# Patient Record
Sex: Female | Born: 1940 | Hispanic: No | Smoking: Never smoker
Health system: Southern US, Community
[De-identification: ages and names within clinical notes are randomized; demographics above are authoritative.]

## PROBLEM LIST (undated history)

## (undated) DIAGNOSIS — E039 Hypothyroidism, unspecified: Secondary | ICD-10-CM

## (undated) DIAGNOSIS — J189 Pneumonia, unspecified organism: Secondary | ICD-10-CM

## (undated) DIAGNOSIS — M1990 Unspecified osteoarthritis, unspecified site: Secondary | ICD-10-CM

## (undated) DIAGNOSIS — I10 Essential (primary) hypertension: Secondary | ICD-10-CM

## (undated) DIAGNOSIS — K219 Gastro-esophageal reflux disease without esophagitis: Secondary | ICD-10-CM

## (undated) HISTORY — PX: TUBAL LIGATION: SHX77

---

## 1999-06-16 ENCOUNTER — Other Ambulatory Visit: Admission: RE | Admit: 1999-06-16 | Discharge: 1999-06-16 | Payer: Self-pay | Admitting: Obstetrics & Gynecology

## 1999-06-16 ENCOUNTER — Encounter (INDEPENDENT_AMBULATORY_CARE_PROVIDER_SITE_OTHER): Payer: Self-pay | Admitting: Specialist

## 1999-11-16 ENCOUNTER — Other Ambulatory Visit: Admission: RE | Admit: 1999-11-16 | Discharge: 1999-11-16 | Payer: Self-pay | Admitting: Obstetrics and Gynecology

## 1999-12-02 ENCOUNTER — Encounter (INDEPENDENT_AMBULATORY_CARE_PROVIDER_SITE_OTHER): Payer: Self-pay

## 1999-12-02 ENCOUNTER — Other Ambulatory Visit: Admission: RE | Admit: 1999-12-02 | Discharge: 1999-12-02 | Payer: Self-pay | Admitting: Obstetrics and Gynecology

## 2000-05-04 ENCOUNTER — Other Ambulatory Visit: Admission: RE | Admit: 2000-05-04 | Discharge: 2000-05-04 | Payer: Self-pay | Admitting: Obstetrics and Gynecology

## 2000-11-07 ENCOUNTER — Other Ambulatory Visit: Admission: RE | Admit: 2000-11-07 | Discharge: 2000-11-07 | Payer: Self-pay | Admitting: Obstetrics and Gynecology

## 2007-07-13 ENCOUNTER — Encounter: Admission: RE | Admit: 2007-07-13 | Discharge: 2007-07-13 | Payer: Self-pay | Admitting: Orthopedic Surgery

## 2016-06-06 DIAGNOSIS — J9801 Acute bronchospasm: Secondary | ICD-10-CM | POA: Diagnosis not present

## 2016-06-06 DIAGNOSIS — J40 Bronchitis, not specified as acute or chronic: Secondary | ICD-10-CM | POA: Diagnosis not present

## 2016-06-19 DIAGNOSIS — E785 Hyperlipidemia, unspecified: Secondary | ICD-10-CM | POA: Diagnosis not present

## 2016-06-19 DIAGNOSIS — J0191 Acute recurrent sinusitis, unspecified: Secondary | ICD-10-CM | POA: Diagnosis not present

## 2016-06-19 DIAGNOSIS — K219 Gastro-esophageal reflux disease without esophagitis: Secondary | ICD-10-CM | POA: Diagnosis not present

## 2016-06-19 DIAGNOSIS — H6981 Other specified disorders of Eustachian tube, right ear: Secondary | ICD-10-CM | POA: Diagnosis not present

## 2016-06-19 DIAGNOSIS — E039 Hypothyroidism, unspecified: Secondary | ICD-10-CM | POA: Diagnosis not present

## 2016-06-19 DIAGNOSIS — Z79899 Other long term (current) drug therapy: Secondary | ICD-10-CM | POA: Diagnosis not present

## 2016-06-19 DIAGNOSIS — I10 Essential (primary) hypertension: Secondary | ICD-10-CM | POA: Diagnosis not present

## 2016-06-19 DIAGNOSIS — R05 Cough: Secondary | ICD-10-CM | POA: Diagnosis not present

## 2016-06-21 DIAGNOSIS — J0191 Acute recurrent sinusitis, unspecified: Secondary | ICD-10-CM | POA: Diagnosis not present

## 2016-06-21 DIAGNOSIS — J329 Chronic sinusitis, unspecified: Secondary | ICD-10-CM | POA: Diagnosis not present

## 2016-06-21 DIAGNOSIS — R05 Cough: Secondary | ICD-10-CM | POA: Diagnosis not present

## 2016-09-20 DIAGNOSIS — M5137 Other intervertebral disc degeneration, lumbosacral region: Secondary | ICD-10-CM | POA: Diagnosis not present

## 2016-09-20 DIAGNOSIS — E785 Hyperlipidemia, unspecified: Secondary | ICD-10-CM | POA: Diagnosis not present

## 2016-09-20 DIAGNOSIS — I10 Essential (primary) hypertension: Secondary | ICD-10-CM | POA: Diagnosis not present

## 2016-09-20 DIAGNOSIS — E039 Hypothyroidism, unspecified: Secondary | ICD-10-CM | POA: Diagnosis not present

## 2016-09-20 DIAGNOSIS — M47896 Other spondylosis, lumbar region: Secondary | ICD-10-CM | POA: Diagnosis not present

## 2016-09-20 DIAGNOSIS — M5136 Other intervertebral disc degeneration, lumbar region: Secondary | ICD-10-CM | POA: Diagnosis not present

## 2016-09-20 DIAGNOSIS — M25562 Pain in left knee: Secondary | ICD-10-CM | POA: Diagnosis not present

## 2016-09-20 DIAGNOSIS — M47817 Spondylosis without myelopathy or radiculopathy, lumbosacral region: Secondary | ICD-10-CM | POA: Diagnosis not present

## 2016-09-20 DIAGNOSIS — M47897 Other spondylosis, lumbosacral region: Secondary | ICD-10-CM | POA: Diagnosis not present

## 2016-09-20 DIAGNOSIS — M549 Dorsalgia, unspecified: Secondary | ICD-10-CM | POA: Diagnosis not present

## 2016-10-18 DIAGNOSIS — M1712 Unilateral primary osteoarthritis, left knee: Secondary | ICD-10-CM | POA: Diagnosis not present

## 2016-11-01 DIAGNOSIS — I10 Essential (primary) hypertension: Secondary | ICD-10-CM | POA: Diagnosis not present

## 2016-11-15 DIAGNOSIS — E785 Hyperlipidemia, unspecified: Secondary | ICD-10-CM | POA: Diagnosis not present

## 2016-11-15 DIAGNOSIS — Z79899 Other long term (current) drug therapy: Secondary | ICD-10-CM | POA: Diagnosis not present

## 2016-12-19 DIAGNOSIS — I10 Essential (primary) hypertension: Secondary | ICD-10-CM | POA: Diagnosis not present

## 2016-12-19 DIAGNOSIS — Z79899 Other long term (current) drug therapy: Secondary | ICD-10-CM | POA: Diagnosis not present

## 2016-12-19 DIAGNOSIS — L989 Disorder of the skin and subcutaneous tissue, unspecified: Secondary | ICD-10-CM | POA: Diagnosis not present

## 2016-12-27 DIAGNOSIS — H5203 Hypermetropia, bilateral: Secondary | ICD-10-CM | POA: Diagnosis not present

## 2017-01-02 DIAGNOSIS — I10 Essential (primary) hypertension: Secondary | ICD-10-CM | POA: Diagnosis not present

## 2017-01-02 DIAGNOSIS — M549 Dorsalgia, unspecified: Secondary | ICD-10-CM | POA: Diagnosis not present

## 2017-01-30 DIAGNOSIS — R7309 Other abnormal glucose: Secondary | ICD-10-CM | POA: Diagnosis not present

## 2017-01-30 DIAGNOSIS — E538 Deficiency of other specified B group vitamins: Secondary | ICD-10-CM | POA: Diagnosis not present

## 2017-01-30 DIAGNOSIS — I10 Essential (primary) hypertension: Secondary | ICD-10-CM | POA: Diagnosis not present

## 2017-01-30 DIAGNOSIS — Z79899 Other long term (current) drug therapy: Secondary | ICD-10-CM | POA: Diagnosis not present

## 2017-01-30 DIAGNOSIS — K219 Gastro-esophageal reflux disease without esophagitis: Secondary | ICD-10-CM | POA: Diagnosis not present

## 2017-02-21 DIAGNOSIS — R69 Illness, unspecified: Secondary | ICD-10-CM | POA: Diagnosis not present

## 2017-05-01 DIAGNOSIS — I10 Essential (primary) hypertension: Secondary | ICD-10-CM | POA: Diagnosis not present

## 2017-05-01 DIAGNOSIS — Z79899 Other long term (current) drug therapy: Secondary | ICD-10-CM | POA: Diagnosis not present

## 2017-05-01 DIAGNOSIS — K219 Gastro-esophageal reflux disease without esophagitis: Secondary | ICD-10-CM | POA: Diagnosis not present

## 2017-05-01 DIAGNOSIS — E039 Hypothyroidism, unspecified: Secondary | ICD-10-CM | POA: Diagnosis not present

## 2017-05-01 DIAGNOSIS — E785 Hyperlipidemia, unspecified: Secondary | ICD-10-CM | POA: Diagnosis not present

## 2017-05-01 DIAGNOSIS — M1712 Unilateral primary osteoarthritis, left knee: Secondary | ICD-10-CM | POA: Diagnosis not present

## 2017-05-01 DIAGNOSIS — Z23 Encounter for immunization: Secondary | ICD-10-CM | POA: Diagnosis not present

## 2017-05-03 DIAGNOSIS — M1712 Unilateral primary osteoarthritis, left knee: Secondary | ICD-10-CM | POA: Diagnosis not present

## 2017-05-29 HISTORY — PX: TOTAL KNEE ARTHROPLASTY: SHX125

## 2017-05-31 DIAGNOSIS — Z01818 Encounter for other preprocedural examination: Secondary | ICD-10-CM | POA: Diagnosis not present

## 2017-05-31 DIAGNOSIS — J439 Emphysema, unspecified: Secondary | ICD-10-CM | POA: Diagnosis not present

## 2017-05-31 DIAGNOSIS — R52 Pain, unspecified: Secondary | ICD-10-CM | POA: Diagnosis not present

## 2017-05-31 DIAGNOSIS — M79609 Pain in unspecified limb: Secondary | ICD-10-CM | POA: Diagnosis not present

## 2017-05-31 DIAGNOSIS — Z79899 Other long term (current) drug therapy: Secondary | ICD-10-CM | POA: Diagnosis not present

## 2017-05-31 DIAGNOSIS — E559 Vitamin D deficiency, unspecified: Secondary | ICD-10-CM | POA: Diagnosis not present

## 2017-06-12 DIAGNOSIS — M1712 Unilateral primary osteoarthritis, left knee: Secondary | ICD-10-CM | POA: Diagnosis not present

## 2017-06-13 DIAGNOSIS — E785 Hyperlipidemia, unspecified: Secondary | ICD-10-CM | POA: Diagnosis not present

## 2017-06-13 DIAGNOSIS — Z01818 Encounter for other preprocedural examination: Secondary | ICD-10-CM | POA: Diagnosis not present

## 2017-06-13 DIAGNOSIS — E039 Hypothyroidism, unspecified: Secondary | ICD-10-CM | POA: Diagnosis not present

## 2017-06-13 DIAGNOSIS — I10 Essential (primary) hypertension: Secondary | ICD-10-CM | POA: Diagnosis not present

## 2017-06-19 DIAGNOSIS — Z79899 Other long term (current) drug therapy: Secondary | ICD-10-CM | POA: Diagnosis not present

## 2017-06-19 DIAGNOSIS — Z6841 Body Mass Index (BMI) 40.0 and over, adult: Secondary | ICD-10-CM | POA: Diagnosis not present

## 2017-06-19 DIAGNOSIS — Z7982 Long term (current) use of aspirin: Secondary | ICD-10-CM | POA: Diagnosis not present

## 2017-06-19 DIAGNOSIS — Z471 Aftercare following joint replacement surgery: Secondary | ICD-10-CM | POA: Diagnosis not present

## 2017-06-19 DIAGNOSIS — E785 Hyperlipidemia, unspecified: Secondary | ICD-10-CM | POA: Diagnosis not present

## 2017-06-19 DIAGNOSIS — M1712 Unilateral primary osteoarthritis, left knee: Secondary | ICD-10-CM | POA: Diagnosis not present

## 2017-06-19 DIAGNOSIS — E039 Hypothyroidism, unspecified: Secondary | ICD-10-CM | POA: Diagnosis not present

## 2017-06-19 DIAGNOSIS — I10 Essential (primary) hypertension: Secondary | ICD-10-CM | POA: Diagnosis not present

## 2017-06-19 DIAGNOSIS — K219 Gastro-esophageal reflux disease without esophagitis: Secondary | ICD-10-CM | POA: Diagnosis not present

## 2017-06-19 DIAGNOSIS — Z96652 Presence of left artificial knee joint: Secondary | ICD-10-CM | POA: Diagnosis not present

## 2017-06-22 DIAGNOSIS — I10 Essential (primary) hypertension: Secondary | ICD-10-CM | POA: Diagnosis not present

## 2017-06-22 DIAGNOSIS — I700 Atherosclerosis of aorta: Secondary | ICD-10-CM | POA: Diagnosis not present

## 2017-06-22 DIAGNOSIS — Z9181 History of falling: Secondary | ICD-10-CM | POA: Diagnosis not present

## 2017-06-22 DIAGNOSIS — Z471 Aftercare following joint replacement surgery: Secondary | ICD-10-CM | POA: Diagnosis not present

## 2017-06-22 DIAGNOSIS — Z6841 Body Mass Index (BMI) 40.0 and over, adult: Secondary | ICD-10-CM | POA: Diagnosis not present

## 2017-06-22 DIAGNOSIS — Z96652 Presence of left artificial knee joint: Secondary | ICD-10-CM | POA: Diagnosis not present

## 2017-06-22 DIAGNOSIS — E039 Hypothyroidism, unspecified: Secondary | ICD-10-CM | POA: Diagnosis not present

## 2017-06-22 DIAGNOSIS — K219 Gastro-esophageal reflux disease without esophagitis: Secondary | ICD-10-CM | POA: Diagnosis not present

## 2017-06-25 DIAGNOSIS — Z6841 Body Mass Index (BMI) 40.0 and over, adult: Secondary | ICD-10-CM | POA: Diagnosis not present

## 2017-06-25 DIAGNOSIS — Z471 Aftercare following joint replacement surgery: Secondary | ICD-10-CM | POA: Diagnosis not present

## 2017-06-25 DIAGNOSIS — K219 Gastro-esophageal reflux disease without esophagitis: Secondary | ICD-10-CM | POA: Diagnosis not present

## 2017-06-25 DIAGNOSIS — I10 Essential (primary) hypertension: Secondary | ICD-10-CM | POA: Diagnosis not present

## 2017-06-25 DIAGNOSIS — Z96652 Presence of left artificial knee joint: Secondary | ICD-10-CM | POA: Diagnosis not present

## 2017-06-25 DIAGNOSIS — E039 Hypothyroidism, unspecified: Secondary | ICD-10-CM | POA: Diagnosis not present

## 2017-06-25 DIAGNOSIS — Z9181 History of falling: Secondary | ICD-10-CM | POA: Diagnosis not present

## 2017-06-25 DIAGNOSIS — I700 Atherosclerosis of aorta: Secondary | ICD-10-CM | POA: Diagnosis not present

## 2017-06-26 DIAGNOSIS — I10 Essential (primary) hypertension: Secondary | ICD-10-CM | POA: Diagnosis not present

## 2017-06-26 DIAGNOSIS — I700 Atherosclerosis of aorta: Secondary | ICD-10-CM | POA: Diagnosis not present

## 2017-06-26 DIAGNOSIS — Z9181 History of falling: Secondary | ICD-10-CM | POA: Diagnosis not present

## 2017-06-26 DIAGNOSIS — Z471 Aftercare following joint replacement surgery: Secondary | ICD-10-CM | POA: Diagnosis not present

## 2017-06-26 DIAGNOSIS — K219 Gastro-esophageal reflux disease without esophagitis: Secondary | ICD-10-CM | POA: Diagnosis not present

## 2017-06-26 DIAGNOSIS — E039 Hypothyroidism, unspecified: Secondary | ICD-10-CM | POA: Diagnosis not present

## 2017-06-26 DIAGNOSIS — Z6841 Body Mass Index (BMI) 40.0 and over, adult: Secondary | ICD-10-CM | POA: Diagnosis not present

## 2017-06-26 DIAGNOSIS — Z96652 Presence of left artificial knee joint: Secondary | ICD-10-CM | POA: Diagnosis not present

## 2017-06-27 DIAGNOSIS — Z6841 Body Mass Index (BMI) 40.0 and over, adult: Secondary | ICD-10-CM | POA: Diagnosis not present

## 2017-06-27 DIAGNOSIS — I10 Essential (primary) hypertension: Secondary | ICD-10-CM | POA: Diagnosis not present

## 2017-06-27 DIAGNOSIS — E039 Hypothyroidism, unspecified: Secondary | ICD-10-CM | POA: Diagnosis not present

## 2017-06-27 DIAGNOSIS — K219 Gastro-esophageal reflux disease without esophagitis: Secondary | ICD-10-CM | POA: Diagnosis not present

## 2017-06-27 DIAGNOSIS — Z471 Aftercare following joint replacement surgery: Secondary | ICD-10-CM | POA: Diagnosis not present

## 2017-06-27 DIAGNOSIS — I700 Atherosclerosis of aorta: Secondary | ICD-10-CM | POA: Diagnosis not present

## 2017-06-27 DIAGNOSIS — Z9181 History of falling: Secondary | ICD-10-CM | POA: Diagnosis not present

## 2017-06-27 DIAGNOSIS — Z96652 Presence of left artificial knee joint: Secondary | ICD-10-CM | POA: Diagnosis not present

## 2017-06-28 DIAGNOSIS — I10 Essential (primary) hypertension: Secondary | ICD-10-CM | POA: Diagnosis not present

## 2017-06-28 DIAGNOSIS — K219 Gastro-esophageal reflux disease without esophagitis: Secondary | ICD-10-CM | POA: Diagnosis not present

## 2017-06-28 DIAGNOSIS — Z471 Aftercare following joint replacement surgery: Secondary | ICD-10-CM | POA: Diagnosis not present

## 2017-06-28 DIAGNOSIS — Z9181 History of falling: Secondary | ICD-10-CM | POA: Diagnosis not present

## 2017-06-28 DIAGNOSIS — I700 Atherosclerosis of aorta: Secondary | ICD-10-CM | POA: Diagnosis not present

## 2017-06-28 DIAGNOSIS — Z6841 Body Mass Index (BMI) 40.0 and over, adult: Secondary | ICD-10-CM | POA: Diagnosis not present

## 2017-06-28 DIAGNOSIS — E039 Hypothyroidism, unspecified: Secondary | ICD-10-CM | POA: Diagnosis not present

## 2017-06-28 DIAGNOSIS — Z96652 Presence of left artificial knee joint: Secondary | ICD-10-CM | POA: Diagnosis not present

## 2017-06-29 DIAGNOSIS — Z96652 Presence of left artificial knee joint: Secondary | ICD-10-CM | POA: Diagnosis not present

## 2017-06-29 DIAGNOSIS — Z6841 Body Mass Index (BMI) 40.0 and over, adult: Secondary | ICD-10-CM | POA: Diagnosis not present

## 2017-06-29 DIAGNOSIS — E039 Hypothyroidism, unspecified: Secondary | ICD-10-CM | POA: Diagnosis not present

## 2017-06-29 DIAGNOSIS — K219 Gastro-esophageal reflux disease without esophagitis: Secondary | ICD-10-CM | POA: Diagnosis not present

## 2017-06-29 DIAGNOSIS — Z471 Aftercare following joint replacement surgery: Secondary | ICD-10-CM | POA: Diagnosis not present

## 2017-06-29 DIAGNOSIS — I10 Essential (primary) hypertension: Secondary | ICD-10-CM | POA: Diagnosis not present

## 2017-06-29 DIAGNOSIS — Z9181 History of falling: Secondary | ICD-10-CM | POA: Diagnosis not present

## 2017-06-29 DIAGNOSIS — I700 Atherosclerosis of aorta: Secondary | ICD-10-CM | POA: Diagnosis not present

## 2017-07-03 DIAGNOSIS — M1712 Unilateral primary osteoarthritis, left knee: Secondary | ICD-10-CM | POA: Diagnosis not present

## 2017-07-03 DIAGNOSIS — M4120 Other idiopathic scoliosis, site unspecified: Secondary | ICD-10-CM | POA: Diagnosis not present

## 2017-07-03 DIAGNOSIS — M25562 Pain in left knee: Secondary | ICD-10-CM | POA: Diagnosis not present

## 2017-07-06 DIAGNOSIS — M1712 Unilateral primary osteoarthritis, left knee: Secondary | ICD-10-CM | POA: Diagnosis not present

## 2017-07-06 DIAGNOSIS — M25562 Pain in left knee: Secondary | ICD-10-CM | POA: Diagnosis not present

## 2017-07-30 DIAGNOSIS — E785 Hyperlipidemia, unspecified: Secondary | ICD-10-CM | POA: Diagnosis not present

## 2017-07-30 DIAGNOSIS — K219 Gastro-esophageal reflux disease without esophagitis: Secondary | ICD-10-CM | POA: Diagnosis not present

## 2017-07-30 DIAGNOSIS — M1712 Unilateral primary osteoarthritis, left knee: Secondary | ICD-10-CM | POA: Diagnosis not present

## 2017-07-30 DIAGNOSIS — E559 Vitamin D deficiency, unspecified: Secondary | ICD-10-CM | POA: Diagnosis not present

## 2017-07-30 DIAGNOSIS — Z79899 Other long term (current) drug therapy: Secondary | ICD-10-CM | POA: Diagnosis not present

## 2017-07-30 DIAGNOSIS — I10 Essential (primary) hypertension: Secondary | ICD-10-CM | POA: Diagnosis not present

## 2017-07-30 DIAGNOSIS — E039 Hypothyroidism, unspecified: Secondary | ICD-10-CM | POA: Diagnosis not present

## 2017-07-30 DIAGNOSIS — M62838 Other muscle spasm: Secondary | ICD-10-CM | POA: Diagnosis not present

## 2017-08-01 DIAGNOSIS — M1712 Unilateral primary osteoarthritis, left knee: Secondary | ICD-10-CM | POA: Diagnosis not present

## 2017-09-03 DIAGNOSIS — Z1231 Encounter for screening mammogram for malignant neoplasm of breast: Secondary | ICD-10-CM | POA: Diagnosis not present

## 2017-09-03 DIAGNOSIS — I10 Essential (primary) hypertension: Secondary | ICD-10-CM | POA: Diagnosis not present

## 2017-09-03 DIAGNOSIS — M7989 Other specified soft tissue disorders: Secondary | ICD-10-CM | POA: Diagnosis not present

## 2017-09-10 DIAGNOSIS — Z79899 Other long term (current) drug therapy: Secondary | ICD-10-CM | POA: Diagnosis not present

## 2017-09-25 DIAGNOSIS — Z1231 Encounter for screening mammogram for malignant neoplasm of breast: Secondary | ICD-10-CM | POA: Diagnosis not present

## 2017-10-04 DIAGNOSIS — Z0000 Encounter for general adult medical examination without abnormal findings: Secondary | ICD-10-CM | POA: Diagnosis not present

## 2017-10-04 DIAGNOSIS — E2839 Other primary ovarian failure: Secondary | ICD-10-CM | POA: Diagnosis not present

## 2017-10-04 DIAGNOSIS — Z23 Encounter for immunization: Secondary | ICD-10-CM | POA: Diagnosis not present

## 2017-10-04 DIAGNOSIS — Z1389 Encounter for screening for other disorder: Secondary | ICD-10-CM | POA: Diagnosis not present

## 2017-10-04 DIAGNOSIS — Z1331 Encounter for screening for depression: Secondary | ICD-10-CM | POA: Diagnosis not present

## 2017-10-26 DIAGNOSIS — E2839 Other primary ovarian failure: Secondary | ICD-10-CM | POA: Diagnosis not present

## 2017-10-26 DIAGNOSIS — Z13820 Encounter for screening for osteoporosis: Secondary | ICD-10-CM | POA: Diagnosis not present

## 2017-10-30 DIAGNOSIS — M7989 Other specified soft tissue disorders: Secondary | ICD-10-CM | POA: Diagnosis not present

## 2017-10-30 DIAGNOSIS — R252 Cramp and spasm: Secondary | ICD-10-CM | POA: Diagnosis not present

## 2017-10-30 DIAGNOSIS — K219 Gastro-esophageal reflux disease without esophagitis: Secondary | ICD-10-CM | POA: Diagnosis not present

## 2017-10-30 DIAGNOSIS — I10 Essential (primary) hypertension: Secondary | ICD-10-CM | POA: Diagnosis not present

## 2017-10-30 DIAGNOSIS — E039 Hypothyroidism, unspecified: Secondary | ICD-10-CM | POA: Diagnosis not present

## 2017-10-30 DIAGNOSIS — E785 Hyperlipidemia, unspecified: Secondary | ICD-10-CM | POA: Diagnosis not present

## 2017-10-30 DIAGNOSIS — R0602 Shortness of breath: Secondary | ICD-10-CM | POA: Diagnosis not present

## 2017-10-30 DIAGNOSIS — Z79899 Other long term (current) drug therapy: Secondary | ICD-10-CM | POA: Diagnosis not present

## 2017-10-30 DIAGNOSIS — E559 Vitamin D deficiency, unspecified: Secondary | ICD-10-CM | POA: Diagnosis not present

## 2017-11-01 DIAGNOSIS — M7989 Other specified soft tissue disorders: Secondary | ICD-10-CM | POA: Diagnosis not present

## 2017-11-01 DIAGNOSIS — M79605 Pain in left leg: Secondary | ICD-10-CM | POA: Diagnosis not present

## 2017-11-01 DIAGNOSIS — R7989 Other specified abnormal findings of blood chemistry: Secondary | ICD-10-CM | POA: Diagnosis not present

## 2017-11-01 DIAGNOSIS — R0602 Shortness of breath: Secondary | ICD-10-CM | POA: Diagnosis not present

## 2017-11-01 DIAGNOSIS — M79662 Pain in left lower leg: Secondary | ICD-10-CM | POA: Diagnosis not present

## 2017-11-01 DIAGNOSIS — R252 Cramp and spasm: Secondary | ICD-10-CM | POA: Diagnosis not present

## 2017-11-30 DIAGNOSIS — M542 Cervicalgia: Secondary | ICD-10-CM | POA: Diagnosis not present

## 2017-12-12 DIAGNOSIS — M1712 Unilateral primary osteoarthritis, left knee: Secondary | ICD-10-CM | POA: Diagnosis not present

## 2017-12-25 ENCOUNTER — Encounter: Payer: Medicare HMO | Admitting: Neurology

## 2018-01-10 DIAGNOSIS — H5203 Hypermetropia, bilateral: Secondary | ICD-10-CM | POA: Diagnosis not present

## 2018-03-01 DIAGNOSIS — R69 Illness, unspecified: Secondary | ICD-10-CM | POA: Diagnosis not present

## 2018-03-01 DIAGNOSIS — E785 Hyperlipidemia, unspecified: Secondary | ICD-10-CM | POA: Diagnosis not present

## 2018-03-01 DIAGNOSIS — E039 Hypothyroidism, unspecified: Secondary | ICD-10-CM | POA: Diagnosis not present

## 2018-03-01 DIAGNOSIS — Z79899 Other long term (current) drug therapy: Secondary | ICD-10-CM | POA: Diagnosis not present

## 2018-03-01 DIAGNOSIS — I10 Essential (primary) hypertension: Secondary | ICD-10-CM | POA: Diagnosis not present

## 2018-03-01 DIAGNOSIS — Z6841 Body Mass Index (BMI) 40.0 and over, adult: Secondary | ICD-10-CM | POA: Diagnosis not present

## 2018-03-01 DIAGNOSIS — R2241 Localized swelling, mass and lump, right lower limb: Secondary | ICD-10-CM | POA: Diagnosis not present

## 2018-04-10 DIAGNOSIS — Z79899 Other long term (current) drug therapy: Secondary | ICD-10-CM | POA: Diagnosis not present

## 2018-06-06 DIAGNOSIS — K219 Gastro-esophageal reflux disease without esophagitis: Secondary | ICD-10-CM | POA: Diagnosis not present

## 2018-06-06 DIAGNOSIS — R0981 Nasal congestion: Secondary | ICD-10-CM | POA: Diagnosis not present

## 2018-06-06 DIAGNOSIS — E785 Hyperlipidemia, unspecified: Secondary | ICD-10-CM | POA: Diagnosis not present

## 2018-06-06 DIAGNOSIS — I10 Essential (primary) hypertension: Secondary | ICD-10-CM | POA: Diagnosis not present

## 2018-06-06 DIAGNOSIS — E039 Hypothyroidism, unspecified: Secondary | ICD-10-CM | POA: Diagnosis not present

## 2018-09-24 ENCOUNTER — Other Ambulatory Visit: Payer: Self-pay

## 2018-09-24 NOTE — Patient Outreach (Signed)
Tiffany Sparks  09/24/2018  Tiffany Sparks 29-Dec-1940 [id number]   Medication Adherence call to Tiffany Sparks [facility] Ident[employer] Verify spoke with patient she is due on Losartan 100 mg and Atorvastatin 40 mg she explain she is taking 1 tablet daily on both and she already place an order from [facility] and is expecting it soon Tiffany Sparks is showing past due under [place].   [place] Sparks Direct Dial [phone]  Fax [phone] [email or web address]

## 2018-10-24 DIAGNOSIS — E039 Hypothyroidism, unspecified: Secondary | ICD-10-CM | POA: Diagnosis not present

## 2018-10-24 DIAGNOSIS — I10 Essential (primary) hypertension: Secondary | ICD-10-CM | POA: Diagnosis not present

## 2018-10-24 DIAGNOSIS — Z79899 Other long term (current) drug therapy: Secondary | ICD-10-CM | POA: Diagnosis not present

## 2018-10-24 DIAGNOSIS — E785 Hyperlipidemia, unspecified: Secondary | ICD-10-CM | POA: Diagnosis not present

## 2018-10-24 DIAGNOSIS — K219 Gastro-esophageal reflux disease without esophagitis: Secondary | ICD-10-CM | POA: Diagnosis not present

## 2018-10-24 DIAGNOSIS — M542 Cervicalgia: Secondary | ICD-10-CM | POA: Diagnosis not present

## 2018-10-25 ENCOUNTER — Other Ambulatory Visit: Payer: Self-pay

## 2018-10-25 NOTE — Patient Outreach (Signed)
[facility]) Care Management  10/25/2018  Tiffany Sparks April 13, 1941 [id number]   Medication Adherence call to Tiffany Sparks patient did not answer pt is due on Losartan under [place].   [place] Management Direct Dial [phone]  Fax [phone] [email or web address]

## 2018-11-05 ENCOUNTER — Other Ambulatory Visit: Payer: Self-pay

## 2018-11-05 NOTE — Patient Outreach (Signed)
[facility]) Care Management  11/05/2018  Baneen Wieseler 1940-07-09 [id number]    Medication Adherence call to Mrs. Willaim Rayas Hippa Identifiers Verify spoke with patient she is past due on Losartan 100 mg and Atorvastatin 40 mg patient explain she is taking 1 tablet daily and never miss a dose when needs it,she orders from [facility]. Mrs. Leger is showing past due under [place].   [facility] Management Direct Dial [phone]  Fax [phone] [email or web address]

## 2018-11-14 DIAGNOSIS — L608 Other nail disorders: Secondary | ICD-10-CM | POA: Insufficient documentation

## 2018-11-18 DIAGNOSIS — H938X9 Other specified disorders of ear, unspecified ear: Secondary | ICD-10-CM | POA: Diagnosis not present

## 2018-11-18 DIAGNOSIS — Z77122 Contact with and (suspected) exposure to noise: Secondary | ICD-10-CM | POA: Diagnosis not present

## 2018-11-18 DIAGNOSIS — H9190 Unspecified hearing loss, unspecified ear: Secondary | ICD-10-CM | POA: Diagnosis not present

## 2018-11-18 DIAGNOSIS — H903 Sensorineural hearing loss, bilateral: Secondary | ICD-10-CM | POA: Diagnosis not present

## 2018-11-28 DIAGNOSIS — R5383 Other fatigue: Secondary | ICD-10-CM | POA: Diagnosis not present

## 2018-11-28 DIAGNOSIS — I10 Essential (primary) hypertension: Secondary | ICD-10-CM | POA: Diagnosis not present

## 2018-11-28 DIAGNOSIS — E611 Iron deficiency: Secondary | ICD-10-CM | POA: Diagnosis not present

## 2018-11-28 DIAGNOSIS — E039 Hypothyroidism, unspecified: Secondary | ICD-10-CM | POA: Diagnosis not present

## 2018-11-28 DIAGNOSIS — Z79899 Other long term (current) drug therapy: Secondary | ICD-10-CM | POA: Diagnosis not present

## 2018-12-06 DIAGNOSIS — Z79899 Other long term (current) drug therapy: Secondary | ICD-10-CM | POA: Diagnosis not present

## 2019-01-30 DIAGNOSIS — H2513 Age-related nuclear cataract, bilateral: Secondary | ICD-10-CM | POA: Diagnosis not present

## 2019-01-30 DIAGNOSIS — H40013 Open angle with borderline findings, low risk, bilateral: Secondary | ICD-10-CM | POA: Diagnosis not present

## 2019-02-08 DIAGNOSIS — M7072 Other bursitis of hip, left hip: Secondary | ICD-10-CM | POA: Diagnosis not present

## 2019-02-08 DIAGNOSIS — M62830 Muscle spasm of back: Secondary | ICD-10-CM | POA: Diagnosis not present

## 2019-02-11 DIAGNOSIS — Q6432 Congenital stricture of urethra: Secondary | ICD-10-CM | POA: Diagnosis not present

## 2019-02-17 DIAGNOSIS — M62838 Other muscle spasm: Secondary | ICD-10-CM | POA: Diagnosis not present

## 2019-02-17 DIAGNOSIS — M549 Dorsalgia, unspecified: Secondary | ICD-10-CM | POA: Diagnosis not present

## 2019-04-28 DIAGNOSIS — E785 Hyperlipidemia, unspecified: Secondary | ICD-10-CM | POA: Diagnosis not present

## 2019-04-28 DIAGNOSIS — E039 Hypothyroidism, unspecified: Secondary | ICD-10-CM | POA: Diagnosis not present

## 2019-04-28 DIAGNOSIS — Z79899 Other long term (current) drug therapy: Secondary | ICD-10-CM | POA: Diagnosis not present

## 2019-04-28 DIAGNOSIS — Z0000 Encounter for general adult medical examination without abnormal findings: Secondary | ICD-10-CM | POA: Diagnosis not present

## 2019-04-28 DIAGNOSIS — I10 Essential (primary) hypertension: Secondary | ICD-10-CM | POA: Diagnosis not present

## 2019-04-28 DIAGNOSIS — Z7189 Other specified counseling: Secondary | ICD-10-CM | POA: Diagnosis not present

## 2019-04-28 DIAGNOSIS — M5136 Other intervertebral disc degeneration, lumbar region: Secondary | ICD-10-CM | POA: Diagnosis not present

## 2019-05-05 ENCOUNTER — Other Ambulatory Visit: Payer: Self-pay | Admitting: Internal Medicine

## 2019-05-05 DIAGNOSIS — M5136 Other intervertebral disc degeneration, lumbar region: Secondary | ICD-10-CM

## 2019-05-05 DIAGNOSIS — M4726 Other spondylosis with radiculopathy, lumbar region: Secondary | ICD-10-CM

## 2019-06-02 ENCOUNTER — Ambulatory Visit
Admission: RE | Admit: 2019-06-02 | Discharge: 2019-06-02 | Disposition: A | Discharge status: Home / Self Care | Payer: Medicare Other | Source: Ambulatory Visit | Attending: Internal Medicine | Admitting: Internal Medicine

## 2019-06-02 ENCOUNTER — Other Ambulatory Visit: Payer: Self-pay

## 2019-06-02 DIAGNOSIS — M5136 Other intervertebral disc degeneration, lumbar region: Secondary | ICD-10-CM

## 2019-06-02 DIAGNOSIS — M48061 Spinal stenosis, lumbar region without neurogenic claudication: Secondary | ICD-10-CM | POA: Diagnosis not present

## 2019-06-02 DIAGNOSIS — M4726 Other spondylosis with radiculopathy, lumbar region: Secondary | ICD-10-CM

## 2019-07-30 DIAGNOSIS — M5126 Other intervertebral disc displacement, lumbar region: Secondary | ICD-10-CM | POA: Diagnosis not present

## 2019-07-30 DIAGNOSIS — M545 Low back pain: Secondary | ICD-10-CM | POA: Diagnosis not present

## 2019-07-30 DIAGNOSIS — M4316 Spondylolisthesis, lumbar region: Secondary | ICD-10-CM | POA: Diagnosis not present

## 2019-07-30 DIAGNOSIS — M5416 Radiculopathy, lumbar region: Secondary | ICD-10-CM | POA: Diagnosis not present

## 2019-07-30 DIAGNOSIS — M48061 Spinal stenosis, lumbar region without neurogenic claudication: Secondary | ICD-10-CM | POA: Diagnosis not present

## 2019-09-11 DIAGNOSIS — M48061 Spinal stenosis, lumbar region without neurogenic claudication: Secondary | ICD-10-CM | POA: Diagnosis not present

## 2019-09-25 DIAGNOSIS — E559 Vitamin D deficiency, unspecified: Secondary | ICD-10-CM | POA: Diagnosis not present

## 2019-09-25 DIAGNOSIS — I10 Essential (primary) hypertension: Secondary | ICD-10-CM | POA: Diagnosis not present

## 2019-09-25 DIAGNOSIS — E039 Hypothyroidism, unspecified: Secondary | ICD-10-CM | POA: Diagnosis not present

## 2019-09-25 DIAGNOSIS — E785 Hyperlipidemia, unspecified: Secondary | ICD-10-CM | POA: Diagnosis not present

## 2019-09-25 DIAGNOSIS — M549 Dorsalgia, unspecified: Secondary | ICD-10-CM | POA: Diagnosis not present

## 2019-10-20 DIAGNOSIS — M5126 Other intervertebral disc displacement, lumbar region: Secondary | ICD-10-CM | POA: Diagnosis not present

## 2019-10-20 DIAGNOSIS — M5416 Radiculopathy, lumbar region: Secondary | ICD-10-CM | POA: Diagnosis not present

## 2019-10-20 DIAGNOSIS — M4316 Spondylolisthesis, lumbar region: Secondary | ICD-10-CM | POA: Diagnosis not present

## 2019-10-20 DIAGNOSIS — M545 Low back pain: Secondary | ICD-10-CM | POA: Diagnosis not present

## 2019-10-20 DIAGNOSIS — M48061 Spinal stenosis, lumbar region without neurogenic claudication: Secondary | ICD-10-CM | POA: Diagnosis not present

## 2019-11-13 DIAGNOSIS — D72829 Elevated white blood cell count, unspecified: Secondary | ICD-10-CM | POA: Diagnosis not present

## 2019-11-13 DIAGNOSIS — I10 Essential (primary) hypertension: Secondary | ICD-10-CM | POA: Diagnosis not present

## 2019-12-18 DIAGNOSIS — J310 Chronic rhinitis: Secondary | ICD-10-CM | POA: Diagnosis not present

## 2019-12-18 DIAGNOSIS — H938X2 Other specified disorders of left ear: Secondary | ICD-10-CM | POA: Diagnosis not present

## 2019-12-22 DIAGNOSIS — M4316 Spondylolisthesis, lumbar region: Secondary | ICD-10-CM | POA: Diagnosis not present

## 2019-12-22 DIAGNOSIS — M545 Low back pain: Secondary | ICD-10-CM | POA: Diagnosis not present

## 2020-01-06 DIAGNOSIS — Q6432 Congenital stricture of urethra: Secondary | ICD-10-CM | POA: Diagnosis not present

## 2020-01-26 DIAGNOSIS — M5416 Radiculopathy, lumbar region: Secondary | ICD-10-CM | POA: Diagnosis not present

## 2020-01-26 DIAGNOSIS — M4316 Spondylolisthesis, lumbar region: Secondary | ICD-10-CM | POA: Diagnosis not present

## 2020-01-30 DIAGNOSIS — H2513 Age-related nuclear cataract, bilateral: Secondary | ICD-10-CM | POA: Diagnosis not present

## 2020-03-15 DIAGNOSIS — M25462 Effusion, left knee: Secondary | ICD-10-CM | POA: Diagnosis not present

## 2020-03-15 DIAGNOSIS — M7989 Other specified soft tissue disorders: Secondary | ICD-10-CM | POA: Diagnosis not present

## 2020-03-15 DIAGNOSIS — W19XXXA Unspecified fall, initial encounter: Secondary | ICD-10-CM | POA: Diagnosis not present

## 2020-03-15 DIAGNOSIS — R0902 Hypoxemia: Secondary | ICD-10-CM | POA: Diagnosis not present

## 2020-03-15 DIAGNOSIS — R609 Edema, unspecified: Secondary | ICD-10-CM | POA: Diagnosis not present

## 2020-03-15 DIAGNOSIS — S8002XA Contusion of left knee, initial encounter: Secondary | ICD-10-CM | POA: Diagnosis not present

## 2020-03-15 DIAGNOSIS — Z96652 Presence of left artificial knee joint: Secondary | ICD-10-CM | POA: Diagnosis not present

## 2020-03-15 DIAGNOSIS — R52 Pain, unspecified: Secondary | ICD-10-CM | POA: Diagnosis not present

## 2020-03-24 DIAGNOSIS — Z96652 Presence of left artificial knee joint: Secondary | ICD-10-CM | POA: Diagnosis not present

## 2020-03-24 DIAGNOSIS — M7502 Adhesive capsulitis of left shoulder: Secondary | ICD-10-CM | POA: Diagnosis not present

## 2020-04-28 DIAGNOSIS — Z79899 Other long term (current) drug therapy: Secondary | ICD-10-CM | POA: Diagnosis not present

## 2020-04-28 DIAGNOSIS — M5136 Other intervertebral disc degeneration, lumbar region: Secondary | ICD-10-CM | POA: Diagnosis not present

## 2020-04-28 DIAGNOSIS — I10 Essential (primary) hypertension: Secondary | ICD-10-CM | POA: Diagnosis not present

## 2020-04-28 DIAGNOSIS — E039 Hypothyroidism, unspecified: Secondary | ICD-10-CM | POA: Diagnosis not present

## 2020-04-28 DIAGNOSIS — E785 Hyperlipidemia, unspecified: Secondary | ICD-10-CM | POA: Diagnosis not present

## 2020-04-30 ENCOUNTER — Other Ambulatory Visit: Payer: Self-pay

## 2020-04-30 ENCOUNTER — Encounter: Payer: Self-pay | Admitting: Sports Medicine

## 2020-04-30 ENCOUNTER — Ambulatory Visit: Payer: Medicare Other | Admitting: Sports Medicine

## 2020-04-30 ENCOUNTER — Other Ambulatory Visit: Payer: Self-pay | Admitting: *Deleted

## 2020-04-30 DIAGNOSIS — M79675 Pain in left toe(s): Secondary | ICD-10-CM

## 2020-04-30 DIAGNOSIS — I739 Peripheral vascular disease, unspecified: Secondary | ICD-10-CM | POA: Diagnosis not present

## 2020-04-30 DIAGNOSIS — M79674 Pain in right toe(s): Secondary | ICD-10-CM | POA: Diagnosis not present

## 2020-04-30 DIAGNOSIS — L603 Nail dystrophy: Secondary | ICD-10-CM | POA: Diagnosis not present

## 2020-04-30 DIAGNOSIS — L600 Ingrowing nail: Secondary | ICD-10-CM

## 2020-04-30 NOTE — Progress Notes (Signed)
Subjective: Tiffany Sparks is a 79 y.o. female patient seen today in office with complaint of mildly painful thickened left>right 1st toenails; unable to trim. Patient reports that she had her toenails removed several years ago and they have grown back like this.  Patient reports most pain with pressure from shoe on the nail grows out and digs into the skin left greater than right first toe.  Patient denies history of Diabetes, Neuropathy, or known vascular disease but is on a baby aspirin and has issues with varicose veins. Patient has no other pedal complaints at this time.   Patient Active Problem List   Diagnosis Date Noted   Acquired deformity of toenail 11/14/2018    No current outpatient medications on file prior to visit.   No current facility-administered medications on file prior to visit.    No Known Allergies  Objective: Physical Exam  General: Well developed, nourished, no acute distress, awake, alert and oriented x 3  Vascular: Dorsalis pedis artery 1/4 bilateral, Posterior tibial artery 0/4 bilateral, skin temperature warm to warm proximal to distal bilateral lower extremities, moderate varicosities, scant pedal hair present bilateral.  Neurological: Gross sensation present via light touch bilateral.   Dermatological: Skin is warm, dry, and supple bilateral, Bilateral hallux nails are tender, long, thick, and discolored with mild subungal debris and irregular growth due to previous history of nail procedure years ago, no webspace macerations present bilateral, no open lesions present bilateral, no callus/corns/hyperkeratotic tissue present bilateral. No signs of infection bilateral.  Musculoskeletal: No symptomatic boney deformities noted bilateral. Muscular strength within normal limits without painon range of motion. No pain with calf compression bilateral.  Assessment and Plan:  Problem List Items Addressed This Visit    None    Visit Diagnoses    Nail dystrophy    -   Primary   Ingrown nail       Toe pain, bilateral       PVD (peripheral vascular disease) ([place])          -Examined patient.  -Discussed treatment options for painful dystrophic nails -Mechanically debrided and reduced dystrophic nails with sterile nail nipper and dremel nail file without incident. -Advised patient if these continue to grow out bothersome or dig into the skin may consider removal of nail -Patient to return as needed for follow up evaluation or sooner if symptoms worsen.  Landis Martins, DPM

## 2020-06-15 DIAGNOSIS — J029 Acute pharyngitis, unspecified: Secondary | ICD-10-CM | POA: Diagnosis not present

## 2020-06-15 DIAGNOSIS — J0100 Acute maxillary sinusitis, unspecified: Secondary | ICD-10-CM | POA: Diagnosis not present

## 2020-08-25 DIAGNOSIS — M5416 Radiculopathy, lumbar region: Secondary | ICD-10-CM | POA: Diagnosis not present

## 2020-08-25 DIAGNOSIS — M4316 Spondylolisthesis, lumbar region: Secondary | ICD-10-CM | POA: Diagnosis not present

## 2020-08-25 DIAGNOSIS — M48061 Spinal stenosis, lumbar region without neurogenic claudication: Secondary | ICD-10-CM | POA: Diagnosis not present

## 2020-08-25 DIAGNOSIS — I10 Essential (primary) hypertension: Secondary | ICD-10-CM | POA: Diagnosis not present

## 2020-08-25 DIAGNOSIS — M5126 Other intervertebral disc displacement, lumbar region: Secondary | ICD-10-CM | POA: Diagnosis not present

## 2020-08-25 DIAGNOSIS — M545 Low back pain, unspecified: Secondary | ICD-10-CM | POA: Diagnosis not present

## 2020-08-27 ENCOUNTER — Other Ambulatory Visit: Payer: Self-pay | Admitting: Neurosurgery

## 2020-08-27 DIAGNOSIS — M4316 Spondylolisthesis, lumbar region: Secondary | ICD-10-CM

## 2020-09-01 DIAGNOSIS — M48061 Spinal stenosis, lumbar region without neurogenic claudication: Secondary | ICD-10-CM | POA: Diagnosis not present

## 2020-09-01 DIAGNOSIS — R600 Localized edema: Secondary | ICD-10-CM | POA: Diagnosis not present

## 2020-09-01 DIAGNOSIS — Z79899 Other long term (current) drug therapy: Secondary | ICD-10-CM | POA: Diagnosis not present

## 2020-09-11 ENCOUNTER — Ambulatory Visit
Admission: RE | Admit: 2020-09-11 | Discharge: 2020-09-11 | Disposition: A | Discharge status: Home / Self Care | Payer: Medicare Other | Source: Ambulatory Visit | Attending: Neurosurgery | Admitting: Neurosurgery

## 2020-09-11 ENCOUNTER — Other Ambulatory Visit: Payer: Self-pay

## 2020-09-11 DIAGNOSIS — M4316 Spondylolisthesis, lumbar region: Secondary | ICD-10-CM

## 2020-09-11 DIAGNOSIS — M48061 Spinal stenosis, lumbar region without neurogenic claudication: Secondary | ICD-10-CM | POA: Diagnosis not present

## 2020-09-11 DIAGNOSIS — M545 Low back pain, unspecified: Secondary | ICD-10-CM | POA: Diagnosis not present

## 2020-09-13 DIAGNOSIS — M48062 Spinal stenosis, lumbar region with neurogenic claudication: Secondary | ICD-10-CM | POA: Diagnosis not present

## 2020-09-13 DIAGNOSIS — M48061 Spinal stenosis, lumbar region without neurogenic claudication: Secondary | ICD-10-CM | POA: Diagnosis not present

## 2020-09-13 DIAGNOSIS — M545 Low back pain, unspecified: Secondary | ICD-10-CM | POA: Diagnosis not present

## 2020-09-13 DIAGNOSIS — M5416 Radiculopathy, lumbar region: Secondary | ICD-10-CM | POA: Diagnosis not present

## 2020-09-17 ENCOUNTER — Other Ambulatory Visit: Payer: Self-pay | Admitting: Neurosurgery

## 2020-10-12 DIAGNOSIS — E039 Hypothyroidism, unspecified: Secondary | ICD-10-CM | POA: Diagnosis not present

## 2020-10-12 DIAGNOSIS — Z0000 Encounter for general adult medical examination without abnormal findings: Secondary | ICD-10-CM | POA: Diagnosis not present

## 2020-10-12 DIAGNOSIS — Z1231 Encounter for screening mammogram for malignant neoplasm of breast: Secondary | ICD-10-CM | POA: Diagnosis not present

## 2020-10-12 DIAGNOSIS — Z79899 Other long term (current) drug therapy: Secondary | ICD-10-CM | POA: Diagnosis not present

## 2020-10-12 DIAGNOSIS — E559 Vitamin D deficiency, unspecified: Secondary | ICD-10-CM | POA: Diagnosis not present

## 2020-10-12 DIAGNOSIS — I10 Essential (primary) hypertension: Secondary | ICD-10-CM | POA: Diagnosis not present

## 2020-10-12 DIAGNOSIS — E785 Hyperlipidemia, unspecified: Secondary | ICD-10-CM | POA: Diagnosis not present

## 2020-10-21 DIAGNOSIS — M48062 Spinal stenosis, lumbar region with neurogenic claudication: Secondary | ICD-10-CM | POA: Diagnosis not present

## 2020-10-29 ENCOUNTER — Encounter (HOSPITAL_COMMUNITY): Payer: Self-pay

## 2020-10-29 ENCOUNTER — Other Ambulatory Visit: Payer: Self-pay

## 2020-10-29 ENCOUNTER — Encounter (HOSPITAL_COMMUNITY)
Admission: RE | Admit: 2020-10-29 | Discharge: 2020-10-29 | Disposition: A | Discharge status: Home / Self Care | Payer: Medicare Other | Source: Ambulatory Visit | Attending: Neurosurgery | Admitting: Neurosurgery

## 2020-10-29 DIAGNOSIS — Z01818 Encounter for other preprocedural examination: Secondary | ICD-10-CM | POA: Insufficient documentation

## 2020-10-29 DIAGNOSIS — Z1231 Encounter for screening mammogram for malignant neoplasm of breast: Secondary | ICD-10-CM | POA: Diagnosis not present

## 2020-10-29 DIAGNOSIS — Z20822 Contact with and (suspected) exposure to covid-19: Secondary | ICD-10-CM | POA: Diagnosis not present

## 2020-10-29 DIAGNOSIS — Z0389 Encounter for observation for other suspected diseases and conditions ruled out: Secondary | ICD-10-CM | POA: Diagnosis not present

## 2020-10-29 DIAGNOSIS — Z13820 Encounter for screening for osteoporosis: Secondary | ICD-10-CM | POA: Diagnosis not present

## 2020-10-29 DIAGNOSIS — I493 Ventricular premature depolarization: Secondary | ICD-10-CM | POA: Insufficient documentation

## 2020-10-29 HISTORY — DX: Unspecified osteoarthritis, unspecified site: M19.90

## 2020-10-29 HISTORY — DX: Pneumonia, unspecified organism: J18.9

## 2020-10-29 HISTORY — DX: Hypothyroidism, unspecified: E03.9

## 2020-10-29 HISTORY — DX: Essential (primary) hypertension: I10

## 2020-10-29 HISTORY — DX: Gastro-esophageal reflux disease without esophagitis: K21.9

## 2020-10-29 LAB — CBC
HCT: 43.1 % (ref 36.0–46.0)
Hemoglobin: 13.8 g/dL (ref 12.0–15.0)
MCH: 30.2 pg (ref 26.0–34.0)
MCHC: 32 g/dL (ref 30.0–36.0)
MCV: 94.3 fL (ref 80.0–100.0)
Platelets: 271 10*3/uL (ref 150–400)
RBC: 4.57 MIL/uL (ref 3.87–5.11)
RDW: 13.2 % (ref 11.5–15.5)
WBC: 12.4 10*3/uL — ABNORMAL HIGH (ref 4.0–10.5)
nRBC: 0 % (ref 0.0–0.2)

## 2020-10-29 LAB — SURGICAL PCR SCREEN
MRSA, PCR: NEGATIVE
Staphylococcus aureus: NEGATIVE

## 2020-10-29 LAB — BASIC METABOLIC PANEL
Anion gap: 7 (ref 5–15)
BUN: 18 mg/dL (ref 8–23)
CO2: 33 mmol/L — ABNORMAL HIGH (ref 22–32)
Calcium: 9.7 mg/dL (ref 8.9–10.3)
Chloride: 96 mmol/L — ABNORMAL LOW (ref 98–111)
Creatinine, Ser: 1.19 mg/dL — ABNORMAL HIGH (ref 0.44–1.00)
GFR, Estimated: 47 mL/min — ABNORMAL LOW (ref >60)
Glucose, Bld: 96 mg/dL (ref 70–99)
Potassium: 3.2 mmol/L — ABNORMAL LOW (ref 3.5–5.1)
Sodium: 136 mmol/L (ref 135–145)

## 2020-10-29 LAB — SARS CORONAVIRUS 2 (TAT 6-24 HRS): SARS Coronavirus 2: NEGATIVE

## 2020-10-29 LAB — TYPE AND SCREEN
ABO/RH(D): O NEG
Antibody Screen: NEGATIVE

## 2020-10-29 NOTE — Progress Notes (Signed)
PCP - Ernestene Kiel, MD Cardiologist - Denies  PPM/ICD - Denies  Chest x-ray - N/A EKG - 10/29/20 Stress Test - Denies ECHO - Denies Cardiac Cath - Denies  Sleep Study - Denies  Pt denies being diabetic.  Blood Thinner Instructions: N/A Aspirin Instructions: Per pt, last dose 10/25/20  ERAS Protcol - N/A PRE-SURGERY Ensure or G2- N/A  COVID TEST- 10/29/20   Anesthesia review: Yes, abnormal EKG  Patient denies shortness of breath, fever, cough and chest pain at PAT appointment   All instructions explained to the patient, with a verbal understanding of the material. Patient agrees to go over the instructions while at home for a better understanding. Patient also instructed to self quarantine after being tested for COVID-19. The opportunity to ask questions was provided.

## 2020-10-29 NOTE — Pre-Procedure Instructions (Signed)
Surgical Instructions:    Your procedure is scheduled on Tuesday June 7th (07:30 AM- 11:32 AM).  Report to [facility] Main Entrance "A" at 05:30 A.M., then check in with the Admitting office.  Call this number if you have any questions prior to, or have any problems the morning of surgery:  [phone]    Remember:  Do not eat or drink after midnight the night before your surgery.     Take these medicines the morning of surgery with A SIP OF WATER: atorvastatin (LIPITOR) levothyroxine (SYNTHROID) omeprazole (PRILOSEC)  IF NEEDED: tiZANidine (ZANAFLEX)    *Follow your surgeon's instructions on when to stop Aspirin.  If no instructions were given by your surgeon then you will need to call the office to get those instructions.     As of today, STOP taking any Aleve, Naproxen, Ibuprofen, Motrin, Advil, Goody's, BC's, all herbal medications, fish oil, and all vitamins.              Special instructions:   [facility]- Preparing For Surgery  Before surgery, you can play an important role. Because skin is not sterile, your skin needs to be as free of germs as possible. You can reduce the number of germs on your skin by washing with CHG (chlorahexidine gluconate) Soap before surgery.  CHG is an antiseptic cleaner which kills germs and bonds with the skin to continue killing germs even after washing.    Oral Hygiene is also important to reduce your risk of infection.  Remember - BRUSH YOUR TEETH THE MORNING OF SURGERY WITH YOUR REGULAR TOOTHPASTE  Please do not use if you have an allergy to CHG or antibacterial soaps. If your skin becomes reddened/irritated stop using the CHG.  Do not shave (including legs and underarms) for at least 48 hours prior to first CHG shower. It is OK to shave your face.  Please follow these instructions carefully.   1. Shower the NIGHT BEFORE SURGERY and the MORNING OF SURGERY  2. If you chose to wash your hair, wash your hair first as usual with your  normal shampoo.  3. After you shampoo, rinse your hair and body thoroughly to remove the shampoo.  4. Wash Face and genitals (private parts) with your normal soap.   5. Use CHG Soap as you would any other liquid soap. You can apply CHG directly to the skin and wash gently with a scrungie or a clean washcloth.   6. Apply the CHG Soap to your body ONLY FROM THE NECK DOWN.  Do not use on open wounds or open sores. Avoid contact with your eyes, ears, mouth and genitals (private parts). Wash Face and genitals (private parts)  with your normal soap.   7. Wash thoroughly, paying special attention to the area where your surgery will be performed.  8. Thoroughly rinse your body with warm water from the neck down.  9. DO NOT shower/wash with your normal soap after using and rinsing off the CHG Soap.  10. Pat yourself dry with a CLEAN TOWEL.  11. Wear CLEAN PAJAMAS to bed the night before surgery.  12. Place CLEAN SHEETS on your bed the night before your surgery.  13. DO NOT SLEEP WITH PETS.   Day of Surgery: SHOWER with CHG soap. Brush your teeth WITH YOUR REGULAR TOOTHPASTE. Wear Clean/Comfortable clothing the morning of surgery. Do not apply any deodorants/lotions.   Do not wear jewelry, make up, or nail polish. Do not shave 48 hours prior to surgery.  Do not bring valuables to the hospital. [facility] is not responsible for any belongings or valuables.   Do NOT Smoke (Tobacco/Vaping) or drink Alcohol 24 hours prior to your procedure.  If you use a CPAP at night, you may bring all equipment for your overnight stay.   Contacts, glasses, or dentures may not be worn into surgery, please bring cases for these belongings.   For patients admitted to the hospital, discharge time will be determined by your treatment team.   Patients discharged the day of surgery will not be allowed to drive home, and someone needs to stay with them for 24 hours.    Please read over the following fact  sheets that you were given.

## 2020-11-01 ENCOUNTER — Encounter (HOSPITAL_COMMUNITY): Payer: Self-pay

## 2020-11-01 NOTE — Progress Notes (Signed)
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  History includes never smoker, HTN, GERD, hypothyroidism, left TKA [facility] 06/16/17).  BMI is consistent with obesity.   Preoperative EKG showed frequent and consecutive PVCs.  No known CAD. Does not follow with a cardiologist. Denied cardiac testing. Denied shortness of breath, fever, cough and chest pain at PAT appointment. Reviewed with anesthesiologist Bryson Ha, MD. If patient asymptomatic then further evaluation on the day of surgery.   I also called and spoke with patient, she says she was started on ASA by a PCP several years ago for family history of cardiac disease, but she has no known personal history of CAD. She says that even with back issues, she stays busy. Her daughter helps with vacuuming, but otherwise is able to clean her house and do walking. She does not have stairs to climb. She denied chest pain, palpitations, syncope/presyncope, SOB and DOE. She takes diuretics for HTN and to control some LE edema. She is not on a KCl supplement (K 3.2). She is asymptomatic from her PVCs.   She lives in [place], [place]. She has two daughters, a granddaughter, and a sister who will be able to help her after she is discharged.   Last ASA 09/28/20.  Second Pfizer Covid vaccine 06/24/20. 10/29/2020 presurgical COVID-19 test negative. Tiffany team to evaluate on the day of surgery.    VS: Pulse (P) 70   Temp (P) 36.9 C (Oral)   Resp (P) 18   Ht (P) 5\' 5"  (1.651 m)   Wt (P) 104.1 kg   SpO2 (P) 95%   BMI (P) 38.17 kg/m    PROVIDERS: Ernestene Kiel, MD is PCP   LABS: Labs reviewed: Acceptable  for surgery. (all labs ordered are listed, but only abnormal results are displayed)  Labs Reviewed  BASIC METABOLIC PANEL - Abnormal; Notable for the following components:      Result Value   Potassium 3.2 (*)    Chloride 96 (*)    CO2 33 (*)    Creatinine, Ser 1.19 (*)    GFR, Estimated 47 (*)    All other components within normal limits  CBC - Abnormal; Notable for the following components:   WBC 12.4 (*)    All other components within normal limits  SARS CORONAVIRUS 2 (TAT 6-24 HRS)  SURGICAL PCR SCREEN  TYPE AND SCREEN     IMAGES: MRI L-spine 09/11/20: IMPRESSION: 1. Multilevel degenerative disease especially affecting facets with L2-3 and L3-4 anterolisthesis. 2. L3-4 severe spinal stenosis. Moderate biforaminal narrowing at the same level. 3. L4-5 moderate spinal stenosis with bilateral L5 impingement at the subarticular recesses.   EKG: 10/29/20:  Sinus rhythm with frequent and consecutive Premature ventricular complexes Possible Left atrial enlargement Nonspecific ST and T wave abnormality Prolonged QT Abnormal ECG Confirmed by Sherren Mocha [phone]) on 10/29/2020 10:29:54 PM - Currently no comparison EKGs available.   CV: She denied prior stress test, echocardiogram, cardiac cath.   Past Medical History:  Diagnosis Date  . Arthritis    back  . GERD (gastroesophageal reflux disease)   . Hypertension   . Hypothyroidism   . Pneumonia  Past Surgical History:  Procedure Laterality Date  . TOTAL KNEE ARTHROPLASTY Left 2019  . TUBAL LIGATION      MEDICATIONS: . aspirin 81 MG EC tablet  . atorvastatin (LIPITOR) 40 MG tablet  . cholecalciferol (VITAMIN D3) 25 MCG (1000 UNIT) tablet  . cyanocobalamin 1000 MCG tablet  . furosemide (LASIX) 40 MG tablet  . hydrochlorothiazide (HYDRODIURIL) 25 MG tablet  . levothyroxine (SYNTHROID) 100 MCG tablet  . losartan (COZAAR) 100 MG tablet  . omeprazole (PRILOSEC) 40 MG capsule  . spironolactone (ALDACTONE) 25  MG tablet  . tiZANidine (ZANAFLEX) 2 MG tablet   No current facility-administered medications for this encounter.    Myra Gianotti, PA-C Surgical Short Stay/Anesthesiology [facility] Phone [phone] [facility] Phone [phone] 11/01/2020 1:53 PM

## 2020-11-01 NOTE — Anesthesia Preprocedure Evaluation (Addendum)
Anesthesia Evaluation  Patient identified by MRN, date of birth, ID band Patient awake    Reviewed: Allergy & Precautions, H&P , NPO status , Patient's Chart, lab work & pertinent test results  Airway Mallampati: II  TM Distance: >3 FB Neck ROM: Full    Dental  (+) Dental Advisory Given, Chipped,    Pulmonary neg pulmonary ROS,    breath sounds clear to auscultation       Cardiovascular Exercise Tolerance: Good hypertension, Pt. on medications negative cardio ROS   Rhythm:Regular Rate:Normal     Neuro/Psych negative neurological ROS  negative psych ROS   GI/Hepatic negative GI ROS, Neg liver ROS, GERD  Medicated and Controlled,  Endo/Other  negative endocrine ROSHypothyroidism Morbid obesity  Renal/GU negative Renal ROS  negative genitourinary   Musculoskeletal  (+) Arthritis , Osteoarthritis,    Abdominal   Peds  Hematology negative hematology ROS (+)   Anesthesia Other Findings   Reproductive/Obstetrics negative OB ROS                          Anesthesia Physical Anesthesia Plan  ASA: III  Anesthesia Plan: General   Post-op Pain Management:    Induction: Intravenous  PONV Risk Score and Plan: 4 or greater and Ondansetron, Dexamethasone and Midazolam  Airway Management Planned: Oral ETT  Additional Equipment:   Intra-op Plan:   Post-operative Plan: Extubation in OR  Informed Consent: I have reviewed the patients History and Physical, chart, labs and discussed the procedure including the risks, benefits and alternatives for the proposed anesthesia with the patient or authorized representative who has indicated his/her understanding and acceptance.     Dental advisory given  Plan Discussed with: CRNA  Anesthesia Plan Comments: (PAT note written 11/01/2020 by Myra Gianotti, PA-C. )      Anesthesia Quick Evaluation

## 2020-11-02 ENCOUNTER — Encounter (HOSPITAL_COMMUNITY): Payer: Self-pay | Admitting: Neurosurgery

## 2020-11-02 ENCOUNTER — Other Ambulatory Visit: Payer: Self-pay

## 2020-11-02 ENCOUNTER — Inpatient Hospital Stay (HOSPITAL_COMMUNITY): Payer: Medicare Other | Admitting: Anesthesiology

## 2020-11-02 ENCOUNTER — Inpatient Hospital Stay (HOSPITAL_COMMUNITY)
Admission: RE | Admit: 2020-11-02 | Discharge: 2020-11-03 | DRG: 460 | Disposition: A | Discharge status: Home Health | Payer: Medicare Other | Attending: Neurosurgery | Admitting: Neurosurgery

## 2020-11-02 ENCOUNTER — Inpatient Hospital Stay (HOSPITAL_COMMUNITY): Payer: Medicare Other

## 2020-11-02 ENCOUNTER — Inpatient Hospital Stay (HOSPITAL_COMMUNITY): Payer: Medicare Other | Admitting: Vascular Surgery

## 2020-11-02 ENCOUNTER — Encounter (HOSPITAL_COMMUNITY)
Admission: RE | Disposition: A | Discharge status: Home Health | Payer: Self-pay | Source: Home / Self Care | Attending: Neurosurgery

## 2020-11-02 DIAGNOSIS — K219 Gastro-esophageal reflux disease without esophagitis: Secondary | ICD-10-CM | POA: Diagnosis present

## 2020-11-02 DIAGNOSIS — Z79890 Hormone replacement therapy (postmenopausal): Secondary | ICD-10-CM | POA: Diagnosis not present

## 2020-11-02 DIAGNOSIS — I10 Essential (primary) hypertension: Secondary | ICD-10-CM | POA: Diagnosis present

## 2020-11-02 DIAGNOSIS — Z96653 Presence of artificial knee joint, bilateral: Secondary | ICD-10-CM | POA: Diagnosis present

## 2020-11-02 DIAGNOSIS — Z79899 Other long term (current) drug therapy: Secondary | ICD-10-CM

## 2020-11-02 DIAGNOSIS — M1990 Unspecified osteoarthritis, unspecified site: Secondary | ICD-10-CM | POA: Diagnosis not present

## 2020-11-02 DIAGNOSIS — M4316 Spondylolisthesis, lumbar region: Secondary | ICD-10-CM | POA: Diagnosis not present

## 2020-11-02 DIAGNOSIS — Z419 Encounter for procedure for purposes other than remedying health state, unspecified: Secondary | ICD-10-CM

## 2020-11-02 DIAGNOSIS — M5416 Radiculopathy, lumbar region: Secondary | ICD-10-CM | POA: Diagnosis present

## 2020-11-02 DIAGNOSIS — M4326 Fusion of spine, lumbar region: Secondary | ICD-10-CM | POA: Diagnosis not present

## 2020-11-02 DIAGNOSIS — Z6838 Body mass index (BMI) 38.0-38.9, adult: Secondary | ICD-10-CM | POA: Diagnosis not present

## 2020-11-02 DIAGNOSIS — E039 Hypothyroidism, unspecified: Secondary | ICD-10-CM | POA: Diagnosis not present

## 2020-11-02 DIAGNOSIS — Z7982 Long term (current) use of aspirin: Secondary | ICD-10-CM

## 2020-11-02 DIAGNOSIS — Z981 Arthrodesis status: Secondary | ICD-10-CM | POA: Diagnosis not present

## 2020-11-02 DIAGNOSIS — M48062 Spinal stenosis, lumbar region with neurogenic claudication: Principal | ICD-10-CM | POA: Diagnosis present

## 2020-11-02 LAB — ABO/RH: ABO/RH(D): O NEG

## 2020-11-02 SURGERY — POSTERIOR LUMBAR FUSION 2 LEVEL
Anesthesia: General | Site: Spine Lumbar

## 2020-11-02 MED ORDER — ATORVASTATIN CALCIUM 40 MG PO TABS: 40.0000 mg | ORAL_TABLET | Freq: Every day | ORAL | Status: DC

## 2020-11-02 MED ORDER — THROMBIN 5000 UNITS EX SOLR
CUTANEOUS | Status: AC
  Filled 2020-11-02: qty 5000

## 2020-11-02 MED ORDER — LEVOTHYROXINE SODIUM 100 MCG PO TABS
100.0000 ug | ORAL_TABLET | Freq: Every day | ORAL | Status: DC
  Administered 2020-11-03: 100 ug via ORAL
  Filled 2020-11-02: qty 1

## 2020-11-02 MED ORDER — ONDANSETRON HCL 4 MG/2ML IJ SOLN
INTRAMUSCULAR | Status: DC | PRN
  Administered 2020-11-02: 4 mg via INTRAVENOUS

## 2020-11-02 MED ORDER — MENTHOL 3 MG MT LOZG: 1.0000 | LOZENGE | OROMUCOSAL | Status: DC | PRN

## 2020-11-02 MED ORDER — THROMBIN 20000 UNITS EX SOLR
CUTANEOUS | Status: AC
  Filled 2020-11-02: qty 20000

## 2020-11-02 MED ORDER — BISACODYL 10 MG RE SUPP: 10.0000 mg | Freq: Every day | RECTAL | Status: DC | PRN

## 2020-11-02 MED ORDER — HYDROMORPHONE HCL 1 MG/ML IJ SOLN
0.2500 mg | INTRAMUSCULAR | Status: DC | PRN
  Administered 2020-11-02: 0.5 mg via INTRAVENOUS

## 2020-11-02 MED ORDER — SODIUM CHLORIDE 0.9% FLUSH
3.0000 mL | Freq: Two times a day (BID) | INTRAVENOUS | Status: DC
  Administered 2020-11-02 (×2): 3 mL via INTRAVENOUS

## 2020-11-02 MED ORDER — ASPIRIN EC 81 MG PO TBEC: 81.0000 mg | DELAYED_RELEASE_TABLET | Freq: Every day | ORAL | Status: DC

## 2020-11-02 MED ORDER — HYDROCODONE-ACETAMINOPHEN 5-325 MG PO TABS
1.0000 | ORAL_TABLET | ORAL | Status: DC | PRN
  Administered 2020-11-02 (×2): 2 via ORAL
  Administered 2020-11-03: 1 via ORAL
  Filled 2020-11-02 (×2): qty 2
  Filled 2020-11-02: qty 1

## 2020-11-02 MED ORDER — SODIUM CHLORIDE 0.9% FLUSH: 3.0000 mL | INTRAVENOUS | Status: DC | PRN

## 2020-11-02 MED ORDER — FENTANYL CITRATE (PF) 100 MCG/2ML IJ SOLN
INTRAMUSCULAR | Status: DC | PRN
  Administered 2020-11-02 (×3): 50 ug via INTRAVENOUS

## 2020-11-02 MED ORDER — LIDOCAINE-EPINEPHRINE 1 %-1:100000 IJ SOLN
INTRAMUSCULAR | Status: AC
  Filled 2020-11-02: qty 1

## 2020-11-02 MED ORDER — HYDROMORPHONE HCL 1 MG/ML IJ SOLN
INTRAMUSCULAR | Status: AC
  Filled 2020-11-02: qty 1

## 2020-11-02 MED ORDER — FENTANYL CITRATE (PF) 250 MCG/5ML IJ SOLN
INTRAMUSCULAR | Status: AC
  Filled 2020-11-02: qty 5

## 2020-11-02 MED ORDER — ACETAMINOPHEN 325 MG PO TABS: 650.0000 mg | ORAL_TABLET | ORAL | Status: DC | PRN

## 2020-11-02 MED ORDER — KETOROLAC TROMETHAMINE 15 MG/ML IJ SOLN
7.5000 mg | Freq: Four times a day (QID) | INTRAMUSCULAR | Status: AC
  Administered 2020-11-02 – 2020-11-03 (×4): 7.5 mg via INTRAVENOUS
  Filled 2020-11-02 (×4): qty 1

## 2020-11-02 MED ORDER — BUPIVACAINE HCL (PF) 0.5 % IJ SOLN
INTRAMUSCULAR | Status: DC | PRN
  Administered 2020-11-02: 5 mL

## 2020-11-02 MED ORDER — VITAMIN B-12 1000 MCG PO TABS
1000.0000 ug | ORAL_TABLET | Freq: Every day | ORAL | Status: DC
  Administered 2020-11-02: 1000 ug via ORAL
  Filled 2020-11-02: qty 1

## 2020-11-02 MED ORDER — LIDOCAINE 2% (20 MG/ML) 5 ML SYRINGE
INTRAMUSCULAR | Status: DC | PRN
  Administered 2020-11-02: 60 mg via INTRAVENOUS

## 2020-11-02 MED ORDER — HYDROMORPHONE HCL 1 MG/ML IJ SOLN: 0.5000 mg | INTRAMUSCULAR | Status: DC | PRN

## 2020-11-02 MED ORDER — TIZANIDINE HCL 4 MG PO TABS
2.0000 mg | ORAL_TABLET | Freq: Three times a day (TID) | ORAL | Status: DC
  Administered 2020-11-02 (×2): 2 mg via ORAL
  Filled 2020-11-02 (×2): qty 1

## 2020-11-02 MED ORDER — CHLORHEXIDINE GLUCONATE CLOTH 2 % EX PADS: 6.0000 | MEDICATED_PAD | Freq: Once | CUTANEOUS | Status: DC

## 2020-11-02 MED ORDER — LACTATED RINGERS IV SOLN: INTRAVENOUS | Status: DC | PRN

## 2020-11-02 MED ORDER — ONDANSETRON HCL 4 MG PO TABS: 4.0000 mg | ORAL_TABLET | Freq: Four times a day (QID) | ORAL | Status: DC | PRN

## 2020-11-02 MED ORDER — ZOLPIDEM TARTRATE 5 MG PO TABS: 5.0000 mg | ORAL_TABLET | Freq: Every evening | ORAL | Status: DC | PRN

## 2020-11-02 MED ORDER — THROMBIN 5000 UNITS EX SOLR: OROMUCOSAL | Status: DC | PRN

## 2020-11-02 MED ORDER — LOSARTAN POTASSIUM 50 MG PO TABS
100.0000 mg | ORAL_TABLET | Freq: Every day | ORAL | Status: DC
  Administered 2020-11-02: 100 mg via ORAL
  Filled 2020-11-02: qty 2

## 2020-11-02 MED ORDER — ONDANSETRON HCL 4 MG/2ML IJ SOLN: 4.0000 mg | Freq: Four times a day (QID) | INTRAMUSCULAR | Status: DC | PRN

## 2020-11-02 MED ORDER — ACETAMINOPHEN 10 MG/ML IV SOLN
INTRAVENOUS | Status: DC | PRN
  Administered 2020-11-02: 1000 mg via INTRAVENOUS

## 2020-11-02 MED ORDER — DOCUSATE SODIUM 100 MG PO CAPS
100.0000 mg | ORAL_CAPSULE | Freq: Two times a day (BID) | ORAL | Status: DC
  Administered 2020-11-02: 100 mg via ORAL
  Filled 2020-11-02: qty 1

## 2020-11-02 MED ORDER — PROPOFOL 10 MG/ML IV BOLUS
INTRAVENOUS | Status: DC | PRN
  Administered 2020-11-02: 80 mg via INTRAVENOUS

## 2020-11-02 MED ORDER — VITAMIN D 25 MCG (1000 UNIT) PO TABS: 1000.0000 [IU] | ORAL_TABLET | ORAL | Status: DC

## 2020-11-02 MED ORDER — FLEET ENEMA 7-19 GM/118ML RE ENEM: 1.0000 | ENEMA | Freq: Once | RECTAL | Status: DC | PRN

## 2020-11-02 MED ORDER — ORAL CARE MOUTH RINSE: 15.0000 mL | Freq: Once | OROMUCOSAL | Status: AC

## 2020-11-02 MED ORDER — METHOCARBAMOL 1000 MG/10ML IJ SOLN
500.0000 mg | Freq: Four times a day (QID) | INTRAVENOUS | Status: DC | PRN
  Filled 2020-11-02: qty 5

## 2020-11-02 MED ORDER — ACETAMINOPHEN 10 MG/ML IV SOLN
INTRAVENOUS | Status: AC
  Filled 2020-11-02: qty 100

## 2020-11-02 MED ORDER — KCL IN DEXTROSE-NACL 20-5-0.45 MEQ/L-%-% IV SOLN: INTRAVENOUS | Status: DC

## 2020-11-02 MED ORDER — DEXAMETHASONE SODIUM PHOSPHATE 10 MG/ML IJ SOLN
INTRAMUSCULAR | Status: DC | PRN
  Administered 2020-11-02: 10 mg via INTRAVENOUS

## 2020-11-02 MED ORDER — BUPIVACAINE LIPOSOME 1.3 % IJ SUSP
INTRAMUSCULAR | Status: AC
  Filled 2020-11-02: qty 20

## 2020-11-02 MED ORDER — CEFAZOLIN SODIUM-DEXTROSE 2-4 GM/100ML-% IV SOLN
2.0000 g | Freq: Three times a day (TID) | INTRAVENOUS | Status: AC
  Administered 2020-11-02 (×2): 2 g via INTRAVENOUS
  Filled 2020-11-02 (×2): qty 100

## 2020-11-02 MED ORDER — LIDOCAINE 2% (20 MG/ML) 5 ML SYRINGE
INTRAMUSCULAR | Status: AC
  Filled 2020-11-02: qty 5

## 2020-11-02 MED ORDER — 0.9 % SODIUM CHLORIDE (POUR BTL) OPTIME
TOPICAL | Status: DC | PRN
  Administered 2020-11-02: 1000 mL

## 2020-11-02 MED ORDER — CEFAZOLIN SODIUM-DEXTROSE 2-4 GM/100ML-% IV SOLN
2.0000 g | INTRAVENOUS | Status: AC
  Administered 2020-11-02: 2 g via INTRAVENOUS
  Filled 2020-11-02: qty 100

## 2020-11-02 MED ORDER — METHOCARBAMOL 500 MG PO TABS: 500.0000 mg | ORAL_TABLET | Freq: Four times a day (QID) | ORAL | Status: DC | PRN

## 2020-11-02 MED ORDER — HYDROCHLOROTHIAZIDE 25 MG PO TABS
25.0000 mg | ORAL_TABLET | Freq: Every day | ORAL | Status: DC
  Administered 2020-11-02: 25 mg via ORAL
  Filled 2020-11-02: qty 1

## 2020-11-02 MED ORDER — LIDOCAINE-EPINEPHRINE 1 %-1:100000 IJ SOLN
INTRAMUSCULAR | Status: DC | PRN
  Administered 2020-11-02: 5 mL

## 2020-11-02 MED ORDER — SODIUM CHLORIDE 0.9 % IV SOLN
250.0000 mL | INTRAVENOUS | Status: DC
  Administered 2020-11-02: 250 mL via INTRAVENOUS

## 2020-11-02 MED ORDER — PROPOFOL 10 MG/ML IV BOLUS
INTRAVENOUS | Status: AC
  Filled 2020-11-02: qty 20

## 2020-11-02 MED ORDER — BUPIVACAINE LIPOSOME 1.3 % IJ SUSP
INTRAMUSCULAR | Status: DC | PRN
  Administered 2020-11-02: 20 mL

## 2020-11-02 MED ORDER — PHENOL 1.4 % MT LIQD
1.0000 | OROMUCOSAL | Status: DC | PRN
  Administered 2020-11-02: 1 via OROMUCOSAL
  Filled 2020-11-02: qty 177

## 2020-11-02 MED ORDER — ROCURONIUM BROMIDE 10 MG/ML (PF) SYRINGE
PREFILLED_SYRINGE | INTRAVENOUS | Status: AC
  Filled 2020-11-02: qty 10

## 2020-11-02 MED ORDER — PANTOPRAZOLE SODIUM 40 MG PO TBEC
40.0000 mg | DELAYED_RELEASE_TABLET | Freq: Every day | ORAL | Status: DC
  Administered 2020-11-02: 40 mg via ORAL
  Filled 2020-11-02: qty 1

## 2020-11-02 MED ORDER — ALUM & MAG HYDROXIDE-SIMETH 200-200-20 MG/5ML PO SUSP: 30.0000 mL | Freq: Four times a day (QID) | ORAL | Status: DC | PRN

## 2020-11-02 MED ORDER — ROCURONIUM BROMIDE 10 MG/ML (PF) SYRINGE
PREFILLED_SYRINGE | INTRAVENOUS | Status: DC | PRN
  Administered 2020-11-02: 20 mg via INTRAVENOUS
  Administered 2020-11-02: 60 mg via INTRAVENOUS
  Administered 2020-11-02: 20 mg via INTRAVENOUS

## 2020-11-02 MED ORDER — ACETAMINOPHEN 650 MG RE SUPP: 650.0000 mg | RECTAL | Status: DC | PRN

## 2020-11-02 MED ORDER — BUPIVACAINE HCL (PF) 0.5 % IJ SOLN
INTRAMUSCULAR | Status: AC
  Filled 2020-11-02: qty 30

## 2020-11-02 MED ORDER — LACTATED RINGERS IV SOLN: INTRAVENOUS | Status: DC

## 2020-11-02 MED ORDER — THROMBIN 20000 UNITS EX SOLR: OROMUCOSAL | Status: DC | PRN

## 2020-11-02 MED ORDER — CHLORHEXIDINE GLUCONATE 0.12 % MT SOLN
15.0000 mL | Freq: Once | OROMUCOSAL | Status: AC
  Administered 2020-11-02: 15 mL via OROMUCOSAL
  Filled 2020-11-02: qty 15

## 2020-11-02 MED ORDER — POLYETHYLENE GLYCOL 3350 17 G PO PACK
17.0000 g | PACK | Freq: Every day | ORAL | Status: DC | PRN
  Administered 2020-11-03: 17 g via ORAL
  Filled 2020-11-02: qty 1

## 2020-11-02 MED ORDER — SUGAMMADEX SODIUM 200 MG/2ML IV SOLN
INTRAVENOUS | Status: DC | PRN
  Administered 2020-11-02: 200 mg via INTRAVENOUS

## 2020-11-02 MED ORDER — PANTOPRAZOLE SODIUM 40 MG IV SOLR: 40.0000 mg | Freq: Every day | INTRAVENOUS | Status: DC

## 2020-11-02 MED ORDER — ONDANSETRON HCL 4 MG/2ML IJ SOLN
INTRAMUSCULAR | Status: AC
  Filled 2020-11-02: qty 2

## 2020-11-02 MED ORDER — FUROSEMIDE 40 MG PO TABS
40.0000 mg | ORAL_TABLET | Freq: Every day | ORAL | Status: DC
  Administered 2020-11-02: 40 mg via ORAL
  Filled 2020-11-02: qty 1

## 2020-11-02 MED ORDER — SPIRONOLACTONE 25 MG PO TABS
25.0000 mg | ORAL_TABLET | Freq: Every day | ORAL | Status: DC
  Administered 2020-11-02: 25 mg via ORAL
  Filled 2020-11-02: qty 1

## 2020-11-02 MED ORDER — DEXAMETHASONE SODIUM PHOSPHATE 10 MG/ML IJ SOLN
INTRAMUSCULAR | Status: AC
  Filled 2020-11-02: qty 1

## 2020-11-02 MED ORDER — OXYCODONE HCL 5 MG PO TABS: 5.0000 mg | ORAL_TABLET | ORAL | Status: DC | PRN

## 2020-11-02 SURGICAL SUPPLY — 78 items
ADH SKN CLS APL DERMABOND .7 (GAUZE/BANDAGES/DRESSINGS) ×1
BASKET BONE COLLECTION (BASKET) ×2 IMPLANT
BLADE CLIPPER SURG (BLADE) IMPLANT
BUR MATCHSTICK NEURO 3.0 LAGG (BURR) ×2 IMPLANT
BUR PRECISION FLUTE 5.0 (BURR) ×2 IMPLANT
CANISTER SUCT 3000ML PPV (MISCELLANEOUS) ×2 IMPLANT
CARTRIDGE OIL MAESTRO DRILL (MISCELLANEOUS) ×1 IMPLANT
CNTNR URN SCR LID CUP LEK RST (MISCELLANEOUS) ×1 IMPLANT
CONT SPEC 4OZ STRL OR WHT (MISCELLANEOUS) ×2
COVER BACK TABLE 60X90IN (DRAPES) ×2 IMPLANT
COVER WAND RF STERILE (DRAPES) ×1 IMPLANT
DECANTER SPIKE VIAL GLASS SM (MISCELLANEOUS) ×1 IMPLANT
DERMABOND ADVANCED (GAUZE/BANDAGES/DRESSINGS) ×1
DERMABOND ADVANCED .7 DNX12 (GAUZE/BANDAGES/DRESSINGS) ×1 IMPLANT
DIFFUSER DRILL AIR PNEUMATIC (MISCELLANEOUS) ×2 IMPLANT
DRAPE C-ARM 42X72 X-RAY (DRAPES) ×2 IMPLANT
DRAPE C-ARMOR (DRAPES) ×2 IMPLANT
DRAPE LAPAROTOMY 100X72X124 (DRAPES) ×2 IMPLANT
DRAPE SURG 17X23 STRL (DRAPES) ×2 IMPLANT
DRSG OPSITE POSTOP 4X8 (GAUZE/BANDAGES/DRESSINGS) ×1 IMPLANT
DURAPREP 26ML APPLICATOR (WOUND CARE) ×2 IMPLANT
ELECT BLADE 4.0 EZ CLEAN MEGAD (MISCELLANEOUS) ×2
ELECT REM PT RETURN 9FT ADLT (ELECTROSURGICAL) ×2
ELECTRODE BLDE 4.0 EZ CLN MEGD (MISCELLANEOUS) IMPLANT
ELECTRODE REM PT RTRN 9FT ADLT (ELECTROSURGICAL) ×1 IMPLANT
EVACUATOR 1/8 PVC DRAIN (DRAIN) ×1 IMPLANT
GAUZE 4X4 16PLY RFD (DISPOSABLE) IMPLANT
GAUZE SPONGE 4X4 12PLY STRL (GAUZE/BANDAGES/DRESSINGS) ×2 IMPLANT
GLOVE BIO SURGEON STRL SZ8 (GLOVE) ×4 IMPLANT
GLOVE BIOGEL PI IND STRL 8.5 (GLOVE) ×2 IMPLANT
GLOVE BIOGEL PI INDICATOR 8.5 (GLOVE) ×2
GLOVE ECLIPSE 8.0 STRL XLNG CF (GLOVE) ×4 IMPLANT
GLOVE EXAM NITRILE XL STR (GLOVE) IMPLANT
GLOVE SRG 8 PF TXTR STRL LF DI (GLOVE) ×2 IMPLANT
GLOVE SURG PR MICRO ENCORE 7.5 (GLOVE) ×1 IMPLANT
GLOVE SURG UNDER POLY LF SZ8 (GLOVE) ×4
GLOVE SURG UNDER POLY LF SZ8.5 (GLOVE) ×1 IMPLANT
GOWN STRL REUS W/ TWL LRG LVL3 (GOWN DISPOSABLE) IMPLANT
GOWN STRL REUS W/ TWL XL LVL3 (GOWN DISPOSABLE) ×3 IMPLANT
GOWN STRL REUS W/TWL 2XL LVL3 (GOWN DISPOSABLE) ×1 IMPLANT
GOWN STRL REUS W/TWL LRG LVL3 (GOWN DISPOSABLE)
GOWN STRL REUS W/TWL XL LVL3 (GOWN DISPOSABLE) ×6
HEMOSTAT POWDER KIT SURGIFOAM (HEMOSTASIS) ×2 IMPLANT
KIT BASIN OR (CUSTOM PROCEDURE TRAY) ×2 IMPLANT
KIT POSITION SURG JACKSON T1 (MISCELLANEOUS) ×2 IMPLANT
KIT TURNOVER KIT B (KITS) ×2 IMPLANT
MILL MEDIUM DISP (BLADE) IMPLANT
MIX DBX 10CC 35% BONE (Bone Implant) ×1 IMPLANT
NDL HYPO 21X1.5 SAFETY (NEEDLE) IMPLANT
NDL HYPO 25X1 1.5 SAFETY (NEEDLE) ×1 IMPLANT
NDL SPNL 18GX3.5 QUINCKE PK (NEEDLE) IMPLANT
NEEDLE HYPO 21X1.5 SAFETY (NEEDLE) ×2 IMPLANT
NEEDLE HYPO 25X1 1.5 SAFETY (NEEDLE) ×2 IMPLANT
NEEDLE SPNL 18GX3.5 QUINCKE PK (NEEDLE) IMPLANT
NS IRRIG 1000ML POUR BTL (IV SOLUTION) ×2 IMPLANT
OIL CARTRIDGE MAESTRO DRILL (MISCELLANEOUS) ×2
PACK LAMINECTOMY NEURO (CUSTOM PROCEDURE TRAY) ×2 IMPLANT
PAD ARMBOARD 7.5X6 YLW CONV (MISCELLANEOUS) ×6 IMPLANT
PATTIES SURGICAL .5 X.5 (GAUZE/BANDAGES/DRESSINGS) IMPLANT
PATTIES SURGICAL .5 X1 (DISPOSABLE) IMPLANT
PATTIES SURGICAL 1X1 (DISPOSABLE) ×1 IMPLANT
ROD RELIN-O LORD 5.5X65MM (Rod) ×1 IMPLANT
ROD RELINE TI LORD 5.5X70 (Rod) ×1 IMPLANT
SCREW LOCK RELINE 5.5 TULIP (Screw) ×6 IMPLANT
SCREW RELINE-O POLY 6.5X45 (Screw) ×6 IMPLANT
SPONGE LAP 4X18 RFD (DISPOSABLE) IMPLANT
SPONGE SURGIFOAM ABS GEL 100 (HEMOSTASIS) ×1 IMPLANT
STAPLER SKIN PROX WIDE 3.9 (STAPLE) IMPLANT
SUT VIC AB 1 CT1 18XBRD ANBCTR (SUTURE) ×2 IMPLANT
SUT VIC AB 1 CT1 8-18 (SUTURE) ×4
SUT VIC AB 2-0 CT1 18 (SUTURE) ×4 IMPLANT
SUT VIC AB 3-0 SH 8-18 (SUTURE) ×4 IMPLANT
SYR 20ML LL LF (SYRINGE) ×1 IMPLANT
SYR 5ML LL (SYRINGE) IMPLANT
TOWEL GREEN STERILE (TOWEL DISPOSABLE) ×2 IMPLANT
TOWEL GREEN STERILE FF (TOWEL DISPOSABLE) ×2 IMPLANT
TRAY FOLEY MTR SLVR 16FR STAT (SET/KITS/TRAYS/PACK) ×2 IMPLANT
WATER STERILE IRR 1000ML POUR (IV SOLUTION) ×2 IMPLANT

## 2020-11-02 NOTE — Progress Notes (Signed)
Orthopedic Tech Progress Note Patient Details:  Tiffany Sparks January 30, 1941 [id number] Patient has brace Patient ID: Tiffany Sparks, female   DOB: 12-30-1940, 80 y.o.   MRN: [id number]   Tiffany Sparks 11/02/2020, 1:02 PM

## 2020-11-02 NOTE — Transfer of Care (Signed)
Immediate Anesthesia Transfer of Care Note  Patient: Tiffany Sparks  Procedure(s) Performed: Lumbar three-four Lumbar four-five Decompression and fusion, without interbodies (N/A Spine Lumbar)  Patient Location: PACU  Anesthesia Type:General  Level of Consciousness: oriented, drowsy and patient cooperative  Airway & Oxygen Therapy: Patient Spontanous Breathing and Patient connected to face mask oxygen  Post-op Assessment: Report given to RN and Post -op Vital signs reviewed and stable  Post vital signs: Reviewed  Last Vitals:  Vitals Value Taken Time  BP 186/144 11/02/20 1101  Temp    Pulse 72 11/02/20 1102  Resp 12 11/02/20 1102  SpO2 100 % 11/02/20 1102  Vitals shown include unvalidated device data.  Last Pain:  Vitals:   11/02/20 0647  TempSrc: Oral  PainSc:          Complications: No complications documented.

## 2020-11-02 NOTE — Evaluation (Signed)
Physical Therapy Evaluation Patient Details Name: Tiffany Sparks MRN: [id number] DOB: 03/23/1941 Today's Date: 11/02/2020   History of Present Illness  Pt presents for PLIF L3-4 and L4-5. PMH: B TKA, GERD, HTN, hypothyroid, arthritis.  Clinical Impression  Pt admitted with above diagnosis. Pt mobilizing well day of surgery, ambulated 150' with RW and min A. Pt presents with mild trendelenberg gait as well as hip flexor tightness and mild balance impairment. Would benefit from HHPT upon return home. Granddaughter also asked about potential for [facility] several days/wk while she and pt's daughter are at work.  Pt currently with functional limitations due to the deficits listed below (see PT Problem List). Pt will benefit from skilled PT to increase their independence and safety with mobility to allow discharge to the venue listed below.       Follow Up Recommendations Home health PT;Supervision - Intermittent [facility])    Equipment Recommendations  None recommended by PT    Recommendations for Other Services       Precautions / Restrictions Precautions Precautions: Back Precaution Booklet Issued: Yes (comment) Precaution Comments: reviewed back precautions and brace wear Required Braces or Orthoses: Spinal Brace Spinal Brace: Lumbar corset;Applied in sitting position Restrictions Weight Bearing Restrictions: No      Mobility  Bed Mobility Overal bed mobility: Needs Assistance Bed Mobility: Rolling;Sidelying to Sit;Sit to Supine Rolling: Min assist Sidelying to sit: Min assist   Sit to supine: Min assist   General bed mobility comments: vc's for sequencing to keep precautions. Min A for LE's off EOB and elevation of trunk into sitting. Min A to LE's for return to supine    Transfers Overall transfer level: Needs assistance Equipment used: Rolling walker (2 wheeled) Transfers: Sit to/from Stand Sit to Stand: Min guard         General transfer comment: min-guard for safety  from bed and toilet. vc's for hand placement  Ambulation/Gait Ambulation/Gait assistance: Min guard Gait Distance (Feet): 150 Feet Assistive device: Rolling walker (2 wheeled) Gait Pattern/deviations: Step-through pattern;Trunk flexed;Decreased stride length Gait velocity: decreased Gait velocity interpretation: <1.8 ft/sec, indicate of risk for recurrent falls General Gait Details: pt flexes hips when stepping. Is able to straighten when static. Worked on trying to maintain erect posture while stepping but pt unable to do this.  Stairs            Wheelchair Mobility    Modified Rankin (Stroke Patients Only)       Balance Overall balance assessment: Needs assistance Sitting-balance support: No upper extremity supported;Feet supported Sitting balance-Leahy Scale: Good     Standing balance support: Single extremity supported;During functional activity Standing balance-Leahy Scale: Fair Standing balance comment: pt with increased reaction time making it difficult for her to correct for imbalance in time to prevent a fall                             Pertinent Vitals/Pain Pain Assessment: 0-10 Pain Score: 6  Pain Location: back Pain Descriptors / Indicators: Aching;Sore Pain Intervention(s): Limited activity within patient's tolerance;Monitored during session;Premedicated before session    [place] expects to be discharged to:: Private residence Living Arrangements: Alone Available Help at Discharge: Family;Available 24 hours/day Type of Home: House Home Access: Ramped entrance     Home Layout: One level Home Equipment: Walker - 2 wheels;Bedside commode;Shower seat Additional Comments: pt's daughter and granddaughter will be staying with her.    Prior Function Level of Independence:  Independent               Hand Dominance        Extremity/Trunk Assessment   Upper Extremity Assessment Upper Extremity Assessment: Defer to  OT evaluation    Lower Extremity Assessment Lower Extremity Assessment: RLE deficits/detail RLE Deficits / Details: mild trendelenberg, hip flex 3-/5, tight hip flexors bilaterally RLE Sensation: WNL RLE Coordination: WNL    Cervical / Trunk Assessment Cervical / Trunk Assessment: Kyphotic  Communication   Communication: No difficulties  Cognition Arousal/Alertness: Awake/alert Behavior During Therapy: WFL for tasks assessed/performed Overall Cognitive Status: Within Functional Limits for tasks assessed                                        General Comments General comments (skin integrity, edema, etc.): discussed activity level upon return home as well as proper positioning in sitting and standing.    Exercises     Assessment/Plan    PT Assessment Patient needs continued PT services  PT Problem List Decreased strength;Decreased activity tolerance;Decreased balance;Decreased mobility;Decreased knowledge of precautions;Pain;Obesity;Decreased range of motion       PT Treatment Interventions DME instruction;Gait training;Functional mobility training;Therapeutic activities;Therapeutic exercise;Balance training;Patient/family education    PT Goals (Current goals can be found in the Care Plan section)  Acute Rehab PT Goals Patient Stated Goal: return home PT Goal Formulation: With patient Time For Goal Achievement: 11/09/20 Potential to Achieve Goals: Good    Frequency Min 5X/week   Barriers to discharge        Co-evaluation               AM-PAC PT "6 Clicks" Mobility  Outcome Measure Help needed turning from your back to your side while in a flat bed without using bedrails?: A Little Help needed moving from lying on your back to sitting on the side of a flat bed without using bedrails?: A Little Help needed moving to and from a bed to a chair (including a wheelchair)?: A Little Help needed standing up from a chair using your arms (e.g.,  wheelchair or bedside chair)?: A Little Help needed to walk in hospital room?: A Little Help needed climbing 3-5 steps with a railing? : A Little 6 Click Score: 18    End of Session Equipment Utilized During Treatment: Gait belt;Back brace Activity Tolerance: Patient tolerated treatment well Patient left: in bed;with call bell/phone within reach;with family/visitor present Nurse Communication: Mobility status PT Visit Diagnosis: Pain;Difficulty in walking, not elsewhere classified (R26.2) Pain - part of body:  (back)    Time: 8638-1771 PT Time Calculation (min) (ACUTE ONLY): 32 min   Charges:   PT Evaluation $PT Eval Low Complexity: 1 Low PT Treatments $Gait Training: 8-22 mins        Leighton Roach, [place]  Pager [phone] Office [place] 11/02/2020, 3:54 PM

## 2020-11-02 NOTE — Interval H&P Note (Signed)
History and Physical Interval Note:  11/02/2020 7:30 AM  Tiffany Sparks  has presented today for surgery, with the diagnosis of Lumbar stenosis with neurogenic claudication.  The various methods of treatment have been discussed with the patient and family. After consideration of risks, benefits and other options for treatment, the patient has consented to  Procedure(s) with comments: Lumbar 3-4 Lumbar 4-5 Decompression and fusion, without interbodies (N/A) - 3C/RM 21 as a surgical intervention.  The patient's history has been reviewed, patient examined, no change in status, stable for surgery.  I have reviewed the patient's chart and labs.  Questions were answered to the patient's satisfaction.     Peggyann [place]

## 2020-11-02 NOTE — Anesthesia Postprocedure Evaluation (Signed)
Anesthesia Post Note  Patient: Sarah Baez  Procedure(s) Performed: Lumbar three-four Lumbar four-five Decompression and fusion, without interbodies (N/A Spine Lumbar)     Patient location during evaluation: PACU Anesthesia Type: General Level of consciousness: awake and alert Pain management: pain level controlled Vital Signs Assessment: post-procedure vital signs reviewed and stable Respiratory status: spontaneous breathing, nonlabored ventilation and respiratory function stable Cardiovascular status: blood pressure returned to baseline and stable Postop Assessment: no apparent nausea or vomiting Anesthetic complications: no   No complications documented.  Last Vitals:  Vitals:   11/02/20 1130 11/02/20 1200  BP: (!) 121/54 (!) 121/59  Pulse: 69 67  Resp: (!) 22 20  Temp: 36.9 C 36.4 C  SpO2: 94% 95%    Last Pain:  Vitals:   11/02/20 1200  TempSrc: Oral  PainSc:                  Emersyn Kotarski,W. EDMOND

## 2020-11-02 NOTE — Op Note (Signed)
11/02/2020  10:50 AM  PATIENT:  Tiffany Sparks  80 y.o. female  PRE-OPERATIVE DIAGNOSIS:  Lumbar stenosis with neurogenic claudication, lumbar spondylolisthesis, lumbago, radiculopathy L 34 and L 45 levels  POST-OPERATIVE DIAGNOSIS:   Lumbar stenosis with neurogenic claudication, lumbar spondylolisthesis, lumbago, radiculopathy L 34 and L 45 levels   PROCEDURE:  Procedure(s): Lumbar three-four Lumbar four-five Decompression and fusion, without interbodies (N/A), pedicle screws, posterolateral arthrodesis  SURGEON:  Surgeon(s) and Role:    Erline Levine, MD - Primary  PHYSICIAN ASSISTANT: Glenford Peers, NP  ASSISTANTS: Poteat, RN   ANESTHESIA:   general  EBL:  200 mL   BLOOD ADMINISTERED:none  DRAINS: (Medium) Hemovact drain(s) in the epidural space with  Suction Open   LOCAL MEDICATIONS USED:  MARCAINE    and LIDOCAINE   SPECIMEN:  No Specimen  DISPOSITION OF SPECIMEN:  N/A  COUNTS:  YES  TOURNIQUET:  * No tourniquets in log *   DICTATION: Patient is 80 year old woman with spondylosis, spondylolisthesis, severe stenosis, DDD, radiculopathy L 34, L 45.  It was elected to take her to surgery for decompression and fusion at L 34 and L 45 levels.    Procedure: Patient was placed in a prone position on the [place] table after smooth and uncomplicated induction of general endotracheal anesthesia. Her low back was prepped and draped in usual sterile fashion with betadine scrub and DuraPrep after marking appropriate anatomy with C arm. Area of incision was infiltrated with local lidocaine. Incision was made to the lumbodorsal fascia was incised and exposure was performed of the L 34, L 45 spinous processes laminae facet joint and transverse processes. Intraoperative x-ray was obtained which confirmed correct orientation. A total laminectomy of L 3 and L 4 was performed with disarticulation of the facet joints at this level and thorough decompression was performed of both L 3, L 4 and L 5  nerve roots along with the common dural tube. There was densely adherent spondylytic material compressing the thecal sac and both L 3 and L 4 nerve roots.  Decompression was greater than would be typically performed for simple interbody fusion. I elected to not place interbody prostheses due to patient's 80 and poor bone quality.   The posterolateral region was extensively decorticated and pedicle probes were placed at L 3, L 4 and L 5 bilaterally. Intraoperative fluoroscopy with LessRay confirmed correct orientationin the AP and lateral plane. 45 x 6.5 mm pedicle screws were placed at L 5 bilaterally and 45 x 6.5 mm screws placed at L 4 bilaterally and 45x 6.31mm screws were placed at L 3 bilaterally. Final x-rays demonstrated well-positioned pedicle screw fixation. 65 mm lordotic rod was placed on the left and 70 mm rod was placed on the right. The rods were locked down in situ and the posterolateral region was packed with the 60 cc bone autograft  Mixed with 10 cc DBX, 35 cc on the right and an equivalent amount on the left. A medium Hemovac drain was placed and anchored with a stitch. 20 cc long-acting Marcaine was injected in the deep musculature.   Fascia was closed with 1 Vicryl sutures skin edges were reapproximated 2 and 3-0 Vicryl sutures. The wound is dressed with Dermabond and an occlusive dressing. The patient was extubated in the operating room and taken to recovery in stable satisfactory condition having tolerated the operation well. Counts were correct at the end of the case.    PLAN OF CARE: Admit to inpatient   PATIENT DISPOSITION:  PACU - hemodynamically stable.   Delay start of Pharmacological VTE agent (>24hrs) due to surgical blood loss or risk of bleeding: yes

## 2020-11-02 NOTE — Anesthesia Procedure Notes (Signed)
Procedure Name: Intubation Date/Time: 11/02/2020 7:43 AM Performed by: Jenne Campus, CRNA Pre-anesthesia Checklist: Patient identified, Emergency Drugs available, Suction available and Patient being monitored Patient Re-evaluated:Patient Re-evaluated prior to induction Oxygen Delivery Method: Circle System Utilized Preoxygenation: Pre-oxygenation with 100% oxygen Induction Type: IV induction Ventilation: Mask ventilation without difficulty Laryngoscope Size: Miller and 2 Grade View: Grade I Tube type: Oral Tube size: 7.5 mm Number of attempts: 1 Airway Equipment and Method: Stylet Placement Confirmation: ETT inserted through vocal cords under direct vision,  positive ETCO2 and breath sounds checked- equal and bilateral Secured at: 20 cm Tube secured with: Tape Dental Injury: Teeth and Oropharynx as per pre-operative assessment

## 2020-11-02 NOTE — Brief Op Note (Signed)
11/02/2020  10:50 AM  PATIENT:  Tiffany Sparks  80 y.o. female  PRE-OPERATIVE DIAGNOSIS:  Lumbar stenosis with neurogenic claudication, lumbar spondylolisthesis, lumbago, radiculopathy L 34 and L 45 levels  POST-OPERATIVE DIAGNOSIS:   Lumbar stenosis with neurogenic claudication, lumbar spondylolisthesis, lumbago, radiculopathy L 34 and L 45 levels   PROCEDURE:  Procedure(s): Lumbar three-four Lumbar four-five Decompression and fusion, without interbodies (N/A), pedicle screws, posterolateral arthrodesis  SURGEON:  Surgeon(s) and Role:    Erline Levine, MD - Primary  PHYSICIAN ASSISTANT: Glenford Peers, NP  ASSISTANTS: Poteat, RN   ANESTHESIA:   general  EBL:  200 mL   BLOOD ADMINISTERED:none  DRAINS: (Medium) Hemovact drain(s) in the epidural space with  Suction Open   LOCAL MEDICATIONS USED:  MARCAINE    and LIDOCAINE   SPECIMEN:  No Specimen  DISPOSITION OF SPECIMEN:  N/A  COUNTS:  YES  TOURNIQUET:  * No tourniquets in log *   DICTATION: Patient is 80 year old woman with spondylosis, spondylolisthesis, severe stenosis, DDD, radiculopathy L 34, L 45.  It was elected to take her to surgery for decompression and fusion at L 34 and L 45 levels.    Procedure: Patient was placed in a prone position on the [place] table after smooth and uncomplicated induction of general endotracheal anesthesia. Her low back was prepped and draped in usual sterile fashion with betadine scrub and DuraPrep after marking appropriate anatomy with C arm. Area of incision was infiltrated with local lidocaine. Incision was made to the lumbodorsal fascia was incised and exposure was performed of the L 34, L 45 spinous processes laminae facet joint and transverse processes. Intraoperative x-ray was obtained which confirmed correct orientation. A total laminectomy of L 3 and L 4 was performed with disarticulation of the facet joints at this level and thorough decompression was performed of both L 3, L 4 and L 5  nerve roots along with the common dural tube. There was densely adherent spondylytic material compressing the thecal sac and both L 3 and L 4 nerve roots.  Decompression was greater than would be typically performed for simple interbody fusion. I elected to not place interbody prostheses due to patient's advanced age and poor bone quality.   The posterolateral region was extensively decorticated and pedicle probes were placed at L 3, L 4 and L 5 bilaterally. Intraoperative fluoroscopy with LessRay confirmed correct orientationin the AP and lateral plane. 45 x 6.5 mm pedicle screws were placed at L 5 bilaterally and 45 x 6.5 mm screws placed at L 4 bilaterally and 45x 6.80mm screws were placed at L 3 bilaterally. Final x-rays demonstrated well-positioned pedicle screw fixation. 65 mm lordotic rod was placed on the left and 70 mm rod was placed on the right. The rods were locked down in situ and the posterolateral region was packed with the 60 cc bone autograft  Mixed with 10 cc DBX, 35 cc on the right and an equivalent amount on the left. A medium Hemovac drain was placed and anchored with a stitch. 20 cc long-acting Marcaine was injected in the deep musculature.   Fascia was closed with 1 Vicryl sutures skin edges were reapproximated 2 and 3-0 Vicryl sutures. The wound is dressed with Dermabond and an occlusive dressing. The patient was extubated in the operating room and taken to recovery in stable satisfactory condition having tolerated the operation well. Counts were correct at the end of the case.    PLAN OF CARE: Admit to inpatient   PATIENT DISPOSITION:  PACU - hemodynamically stable.   Delay start of Pharmacological VTE agent (>24hrs) due to surgical blood loss or risk of bleeding: yes

## 2020-11-02 NOTE — H&P (Signed)
Patient ID:   [id number] Patient: Tiffany Sparks  Date of Birth: 1940/06/22 Visit Type: Office Visit   Date: 09/13/2020 10:45 AM Provider: Marchia Meiers. Vertell Limber MD   This 80 year old female presents for back pain.  HISTORY OF PRESENT ILLNESS: 1.  back pain  Patient returns to review her MRI and x-rays.  The patient is currently 80 years of age  She describes her pain level as 8/10 in severity and more on the right than the left.  Her legs are both giving her a lot of trouble.  We knew prior to her imaging studies that she had a mobile spondylolisthesis of L3 on L4 and has been receiving regular injections at this level.  She has never had back surgery.  Her MRI shows marked degenerative changes the L3-4 level with severe spinal stenosis left greater than right.  There is also significant lateral recess stenosis at the L4-5 level causing bilateral L5 nerve root compression.  She has never had surgery on her back.  I have recommended based findings with out improvement with continued conservative management and injections that we proceed with surgical intervention.  This will consist of L3 through L5 decompression without interbody fusion with pedicle screw fixation L3 through L5 levels.      Medical/Surgical/Interim History Reviewed, no change.  Last detailed document date:07/30/2019.     PAST MEDICAL HISTORY, SURGICAL HISTORY, FAMILY HISTORY, SOCIAL HISTORY AND REVIEW OF SYSTEMS I have reviewed the patient's past medical, surgical, family and social history as well as the comprehensive review of systems as included on the [facility] history form dated 09/11/2019, which I have signed.  Family History: Reviewed, no changes.  Last detailed document date:07/30/2019.   Social History: Reviewed, no changes. Last detailed document date: 07/30/2019.    MEDICATIONS: (added, continued or stopped this  visit) Started Medication Directions Instruction Stopped  aspirin 81 mg tablet,delayed release take 1 tablet by oral route  every day    atorvastatin 40 mg tablet take 1 tablet by oral route  every day    furosemide 40 mg tablet take 1 tablet by oral route  every day    hydrochlorothiazide 25 mg tablet take 1 tablet by oral route  every day    losartan 100 mg tablet take 1 tablet by oral route  every day    omeprazole 40 mg capsule,delayed release take 1 capsule by oral route  every day before a meal    spironolactone 25 mg tablet take 1 tablet by oral route  every day   01/26/2020 tizanidine 4 mg tablet take 1-1.5 tablet by oral route  every 8 hours as needed not to exceed 3 doses in 24 hours    Vitamin B-12  1,000 mcg tablet     Vitamin D3  ORAL       ALLERGIES: Ingredient Reaction Medication Name Comment NO KNOWN ALLERGIES    No known allergies. Reviewed, no changes.    PHYSICAL EXAM:  Vitals Date Temp F BP Pulse Ht In Wt Lb BMI BSA Pain Score 09/13/2020  107/63 88 65 232 38.61  8/10     IMPRESSION:  Severe spinal stenosis with spondylolisthesis L3 on L4 with moderately severe spinal stenosis L4 on L5  PLAN: Proceed with decompression and fusion without interbody grafts L3 through L5 levels.  Risks and benefits were discussed in detail with the patient and she wishes to proceed with surgery.  Orders: Diagnostic Procedures: Assessment Procedure M54.16 Lumbar Spine- AP/Lat Instruction(s)/Education:  Assessment Instruction [phone] Lifestyle education regarding diet Miscellaneous: Assessment  M48.062 LSO Brace  Completed Orders (this encounter) Order Details Reason Side Interpretation Result Initial Treatment Date Region Lifestyle education regarding diet Encouraged patient to eat balanced diet high in fruit and vegetables.        Assessment/Plan  # Detail Type Description   1. Assessment Low back pain, unspecified back pain laterality, unspecified chronicity, unspecified whether sciatica present (M54.50).     2. Assessment Spinal stenosis, lumbar region without neurogenic claud (M48.061).     3. Assessment Lumbar radiculopathy (M54.16).     4. Assessment Lumbar stenosis with neurogenic claudication (M48.062).  Plan Orders LSO Brace.     5. Assessment Body mass index (BMI) 38.0-38.9, adult (Y80.16).  Plan Orders Today's instructions / counseling include(s) Lifestyle education regarding diet. Clinical information/comments: Encouraged patient to eat balanced diet high in fruit and vegetables.       Pain Management Plan Pain Scale: 8/10. Method: Numeric Pain Intensity Scale. Location: back. Onset: 07/30/2019. Duration: varies. Quality: discomforting. Pain management follow-up plan of care: Patient will continue medication management..              Provider:  Marchia Meiers. Vertell Limber MD  09/13/2020 11:56 AM    Dictation edited by: Marchia Meiers. Vertell Limber    CC Providers: Ernestene Kiel [facility] [address] [place],  [place]  [id number]-   Caroline Prochnau  [facility] [address] [place], [place] [address]-               Electronically signed by Marchia Meiers Vertell Limber MD on 09/13/2020 11:56 AM

## 2020-11-03 ENCOUNTER — Encounter (HOSPITAL_COMMUNITY): Payer: Self-pay | Admitting: Neurosurgery

## 2020-11-03 MED ORDER — TIZANIDINE HCL 2 MG PO TABS: 2.0000 mg | ORAL_TABLET | Freq: Three times a day (TID) | ORAL | 0 refills | Status: AC

## 2020-11-03 MED ORDER — HYDROCODONE-ACETAMINOPHEN 5-325 MG PO TABS: 1.0000 | ORAL_TABLET | ORAL | 0 refills | Status: AC | PRN

## 2020-11-03 NOTE — Evaluation (Signed)
Occupational Therapy Evaluation Patient Details Name: Tiffany Sparks MRN: [id number] DOB: 09/20/1940 Today's Date: 11/03/2020    History of Present Illness Pt presents for PLIF L3-4 and L4-5. PMH: B TKA, GERD, HTN, hypothyroid, arthritis.   Clinical Impression   Patient presented for the above procedure.  PTA she lived alone with PRN assist from family.  Daughter and granddaughter will be staying with her for a short amount of time to provide assist as needed.  Barrier impacting function is listed below.  She is close to her baseline for ADL completion, needing cues to adhere to back precautions, but no further OT needs identified in the acute setting.  All questions answered.      Follow Up Recommendations  No OT follow up    Equipment Recommendations  None recommended by OT    Recommendations for Other Services       Precautions / Restrictions Precautions Precautions: Back Required Braces or Orthoses: Spinal Brace Spinal Brace: Lumbar corset;Applied in sitting position Restrictions Weight Bearing Restrictions: No      Mobility Bed Mobility               General bed mobility comments: up in recliner. Patient Response: Cooperative  Transfers Overall transfer level: Needs assistance Equipment used: Rolling walker (2 wheeled) Transfers: Sit to/from [facility] Sit to Stand: Supervision Stand pivot transfers: Modified independent (Device/Increase time)            Balance Overall balance assessment: Needs assistance Sitting-balance support: No upper extremity supported;Feet supported Sitting balance-Leahy Scale: Normal     Standing balance support: Bilateral upper extremity supported Standing balance-Leahy Scale: Fair Standing balance comment: relies on RW, but can static stand without RW                           ADL either performed or assessed with clinical judgement   ADL Overall ADL's : Modified independent                                        General ADL Comments: able to use hip kit pieces for dressing, but VC's needed for back precautions.     Vision Baseline Vision/History: Wears glasses Wears Glasses: At all times       Perception     Praxis      Pertinent Vitals/Pain Pain Assessment: Faces Faces Pain Scale: Hurts a little bit Pain Location: back Pain Descriptors / Indicators: Aching;Sore Pain Intervention(s): Monitored during session     Hand Dominance Right   Extremity/Trunk Assessment Upper Extremity Assessment Upper Extremity Assessment: Overall WFL for tasks assessed   Lower Extremity Assessment Lower Extremity Assessment: Defer to PT evaluation       Communication Communication Communication: No difficulties   Cognition Arousal/Alertness: Awake/alert Behavior During Therapy: WFL for tasks assessed/performed Overall Cognitive Status: Within Functional Limits for tasks assessed                                 General Comments: cueing for back precautions.   General Comments       Exercises     Shoulder Instructions      Home Living Family/patient expects to be discharged to:: Private residence Living Arrangements: Alone Available Help at Discharge: Family;Available 24 hours/day Type of Home: House Home Access: Ramped  entrance     Home Layout: One level               Home Equipment: [place] - 2 wheels;Bedside commode;Shower seat   Additional Comments: pt's daughter and granddaughter will be staying with her.      Prior Functioning/Environment Level of Independence: Independent                 OT Problem List: Impaired balance (sitting and/or standing)      OT Treatment/Interventions:      OT Goals(Current goals can be found in the care plan section)    OT Frequency:     Barriers to D/C:            Co-evaluation              AM-PAC OT "6 Clicks" Daily Activity     Outcome Measure Help from another  person eating meals?: None Help from another person taking care of personal grooming?: None Help from another person toileting, which includes using toliet, bedpan, or urinal?: None Help from another person bathing (including washing, rinsing, drying)?: None Help from another person to put on and taking off regular upper body clothing?: None Help from another person to put on and taking off regular lower body clothing?: None 6 Click Score: 24   End of Session Equipment Utilized During Treatment: Rolling walker Nurse Communication: Mobility status  Activity Tolerance: Patient tolerated treatment well Patient left: in chair;with call bell/phone within reach  OT Visit Diagnosis: Unsteadiness on feet (R26.81)                Time: 2955-3971 OT Time Calculation (min): 17 min Charges:  OT General Charges $OT Visit: 1 Visit OT Evaluation $OT Eval Moderate Complexity: 1 Mod  11/03/2020  Rich, OTR/L  Acute Rehabilitation Services  Office:  [phone]   Metta Clines 11/03/2020, 8:39 AM

## 2020-11-03 NOTE — Progress Notes (Addendum)
Physical Therapy Treatment Patient Details Name: Tiffany Sparks MRN: [id number] DOB: 04-16-1941 Today's Date: 11/03/2020    History of Present Illness Pt is a 80 y/o female who presents s/p L3-L5 PLIF on 11/02/2020. PMH: B TKA, HTN, hypothyroid, arthritis.    PT Comments    Pt progressing towards physical therapy goals. She was able to demonstrate transfers and ambulation with up to min guard assist and RW for support. Pt with decreased maintenance of precautions throughout session and required frequent cues for upright posture. Pt was educated on precautions, brace application/wearing schedule, appropriate activity progression, and car transfer. Will continue to follow.      Follow Up Recommendations  No PT follow up;Supervision for mobility/OOB, [place] Recommendations  None recommended by PT    Recommendations for Other Services       Precautions / Restrictions Precautions Precautions: Back;Fall Precaution Booklet Issued: Yes (comment) Precaution Comments: Reviewed handout and pt was cued for precautions during functional mobility. Required Braces or Orthoses: Spinal Brace Spinal Brace: Lumbar corset;Applied in sitting position Restrictions Weight Bearing Restrictions: No    Mobility  Bed Mobility Overal bed mobility: Modified Independent Bed Mobility: Rolling;Sidelying to Sit           General bed mobility comments: Pt was able to transition to EOB with HOB flat and rails lowered to simulate home environment.    Transfers Overall transfer level: Needs assistance Equipment used: Rolling walker (2 wheeled) Transfers: Sit to/from Stand Sit to Stand: Supervision Stand pivot transfers: Modified independent (Device/Increase time)       General transfer comment: VC's for optimal posture and hand placement on seated surface for safety.  Ambulation/Gait Ambulation/Gait assistance: Min guard;Supervision Gait Distance (Feet): 250 Feet Assistive device:  Rolling walker (2 wheeled) Gait Pattern/deviations: Step-through pattern;Trunk flexed;Decreased stride length Gait velocity: decreased Gait velocity interpretation: <1.31 ft/sec, indicative of household ambulator General Gait Details: VC's for improved posture, closer walker proximity, and forward gaze. Grossly at a min guard level with intermittent supervision for safety.   Stairs Stairs:  (Declined stair training as pt has a ramp)           Wheelchair Mobility    Modified Rankin (Stroke Patients Only)       Balance Overall balance assessment: Needs assistance Sitting-balance support: No upper extremity supported;Feet supported Sitting balance-Leahy Scale: Fair     Standing balance support: Bilateral upper extremity supported Standing balance-Leahy Scale: Fair Standing balance comment: relies on RW, but can static stand without RW                            Cognition Arousal/Alertness: Awake/alert Behavior During Therapy: WFL for tasks assessed/performed Overall Cognitive Status: Within Functional Limits for tasks assessed                                 General Comments: cueing for back precautions.      Exercises      General Comments        Pertinent Vitals/Pain Pain Assessment: Faces Faces Pain Scale: Hurts a little bit Pain Location: back Pain Descriptors / Indicators: Aching;Sore;Operative site guarding Pain Intervention(s): Monitored during session;Limited activity within patient's tolerance;Repositioned    Home Living Family/patient expects to be discharged to:: Private residence Living Arrangements: Alone Available Help at Discharge: Family;Available 24 hours/day Type of Home: House Home Access: Ramped entrance  Home Layout: One level Home Equipment: Walker - 2 wheels;Bedside commode;Shower seat Additional Comments: pt's daughter and granddaughter will be staying with her.    Prior Function Level of Independence:  Independent          PT Goals (current goals can now be found in the care plan section) Acute Rehab PT Goals Patient Stated Goal: return home PT Goal Formulation: With patient Time For Goal Achievement: 11/09/20 Potential to Achieve Goals: Good Progress towards PT goals: Progressing toward goals    Frequency    Min 5X/week      PT Plan Discharge plan needs to be updated    Co-evaluation              AM-PAC PT "6 Clicks" Mobility   Outcome Measure  Help needed turning from your back to your side while in a flat bed without using bedrails?: None Help needed moving from lying on your back to sitting on the side of a flat bed without using bedrails?: None Help needed moving to and from a bed to a chair (including a wheelchair)?: A Little Help needed standing up from a chair using your arms (e.g., wheelchair or bedside chair)?: A Little Help needed to walk in hospital room?: A Little Help needed climbing 3-5 steps with a railing? : A Little 6 Click Score: 20    End of Session Equipment Utilized During Treatment: Gait belt;Back brace Activity Tolerance: Patient tolerated treatment well Patient left: in bed;with call bell/phone within reach;with family/visitor present Nurse Communication: Mobility status PT Visit Diagnosis: Pain;Difficulty in walking, not elsewhere classified (R26.2) Pain - part of body:  (back)     Time: 3300-7622 PT Time Calculation (min) (ACUTE ONLY): 17 min  Charges:  $Gait Training: 8-22 mins                     Rolinda Roan, PT, DPT Acute Rehabilitation Services Pager: [phone] Office: [phone]    Thelma Comp 11/03/2020, 8:50 AM

## 2020-11-03 NOTE — Progress Notes (Signed)
Patient alert and oriented, voiding adequately, skin clean, dry and intact without evidence of skin break down, or symptoms of complications - no redness or edema noted, only slight tenderness at site.  Patient states pain is manageable at time of discharge. Patient has an appointment with MD in 3 weeks 

## 2020-11-03 NOTE — Progress Notes (Signed)
Subjective: Patient reports that she is doing well and reports a resolution of her preoperative symptoms. She has no complaints at this time and is pleased with her progress. No acute events overnight.  Objective: Vital signs in last 24 hours: Temp:  [97.6 F (36.4 C)-98.5 F (36.9 C)] 98 F (36.7 C) (06/08 0754) Pulse Rate:  [58-76] 58 (06/08 0754) Resp:  [13-22] 16 (06/08 0754) BP: (98-145)/(40-60) 98/49 (06/08 0754) SpO2:  [93 %-100 %] 93 % (06/08 0754)  Intake/Output from previous day: 06/07 0701 - 06/08 0700 In: 1400 [I.V.:1200; IV Piggyback:200] Out: 1325 [Urine:665; Drains:460; Blood:200] Intake/Output this shift: No intake/output data recorded.  Physical Exam: Patient is awake, A/O X 4, conversant, and in good spirits. Speech is fluent and appropriate. PERRLA, EOMI. CNs grossly intact. Doing well. MAEW with good strength that is symmetric bilaterally. 5/5 BUE/BLE.   Incision is well approximated with no drainage, erythema, or edema. Dressing is intact with old drainage on the distal portion.   Patient is sitting comfortably in the chair with her LSO brace on.   Lab Results: No results for input(s): WBC, HGB, HCT, PLT in the last 72 hours. BMET No results for input(s): NA, K, CL, CO2, GLUCOSE, BUN, CREATININE, CALCIUM in the last 72 hours.  Studies/Results: DG Lumbar Spine 2-3 Views  Result Date: 11/02/2020 CLINICAL DATA:  L3-L4, L4-L5 fusion. EXAM: DG C-ARM 1-60 MIN; LUMBAR SPINE - 2-3 VIEW FLUOROSCOPY TIME:  Fluoroscopy Time:  39 seconds. Radiation Exposure Index (if provided by the fluoroscopic device): 37.69 mGy. Number of Acquired Spot Images: 3 COMPARISON:  MRI lumbar spine September 11, 2020. FINDINGS: Three C-arm fluoroscopic images were obtained intraoperatively and submitted for post operative interpretation. These images demonstrate bilateral pedicle screws at L3, L4, and L5. No unexpected findings. Please see the performing provider's procedural report for further  detail. IMPRESSION: Intraoperative fluoroscopy, as detailed above. Electronically Signed   By: Margaretha Sheffield MD   On: 11/02/2020 13:06   DG C-Arm 1-60 Min  Result Date: 11/02/2020 CLINICAL DATA:  L3-L4, L4-L5 fusion. EXAM: DG C-ARM 1-60 MIN; LUMBAR SPINE - 2-3 VIEW FLUOROSCOPY TIME:  Fluoroscopy Time:  39 seconds. Radiation Exposure Index (if provided by the fluoroscopic device): 37.69 mGy. Number of Acquired Spot Images: 3 COMPARISON:  MRI lumbar spine September 11, 2020. FINDINGS: Three C-arm fluoroscopic images were obtained intraoperatively and submitted for post operative interpretation. These images demonstrate bilateral pedicle screws at L3, L4, and L5. No unexpected findings. Please see the performing provider's procedural report for further detail. IMPRESSION: Intraoperative fluoroscopy, as detailed above. Electronically Signed   By: Margaretha Sheffield MD   On: 11/02/2020 13:06    Assessment/Plan: Patient is post-op day 1 s/p L3/4 and L4/5 decompression and fusion with pedicle screw fixation and posterolateral arthrodesis. She is recovering well and reports a resolution of her preoperative symptoms. She has no complaints and is pleased with her status.  She has ambulated with PT and is awaiting OT evaluation.  Continue LSO brace when OOB. Continue working on pain control, mobility and ambulating patient. Will plan for discharge today.    LOS: 1 day     Marvis Moeller, DNP, NP-C 11/03/2020, 8:05 AM

## 2020-11-03 NOTE — Discharge Instructions (Signed)
Wound Care Remove outer dressing in 2-3 days Leave incision open to air. You may shower. Do not scrub directly on incision.  Do not put any creams, lotions, or ointments on incision. Activity Walk each and every day, increasing distance each day. No lifting greater than 5 lbs.  Avoid bending, arching, and twisting. No driving for 2 weeks; may ride as a passenger locally. If provided with back brace, wear when out of bed.  It is not necessary to wear in bed. Diet Resume your normal diet.   Call Your Doctor If Any of These Occur Redness, drainage, or swelling at the wound.  Temperature greater than 101 degrees. Severe pain not relieved by pain medication. Incision starts to come apart. Follow Up Appt Call today for appointment in 3 weeks ([phone]) or for problems.  If you have any hardware placed in your spine, you will need an x-ray before your appointment.

## 2020-11-03 NOTE — Discharge Summary (Signed)
Physician Discharge Summary  Patient ID: Tiffany Sparks MRN: [id number] DOB/AGE: 79/13/1942 79 y.o.  Admit date: 11/02/2020 Discharge date: 11/03/2020  Admission Diagnoses: Lumbar stenosis with neurogenic claudication, lumbar spondylolisthesis, lumbago, radiculopathy L 34 and L 45 levels  Discharge Diagnoses: Lumbar stenosis with neurogenic claudication, lumbar spondylolisthesis, lumbago, radiculopathy L 34 and L 45 levels Active Problems:   Spondylolisthesis of lumbar region   Discharged Condition: good  Hospital Course: The patient was admitted on 11/02/2020 and taken to the operating room where the patient underwent decompression and fusion. The patient tolerated the procedure well and was taken to the recovery room and then to the floor in stable condition. The hospital course was routine. There were no complications. The wound remained clean dry and intact. Pt had appropriate back soreness. No complaints of leg pain or new N/T/W. The patient remained afebrile with stable vital signs, and tolerated a regular diet. The patient continued to increase activities, and pain was well controlled with oral pain medications.   Consults: None  Significant Diagnostic Studies: radiology: X-Ray: Intraoperative radiographs  Treatments: surgery: Lumbar three-four Lumbar four-five Decompression and fusion, without interbodies (N/A), pedicle screws, posterolateral arthrodesis  Discharge Exam: Blood pressure (!) 98/49, pulse (!) 58, temperature 98 F (36.7 C), temperature source Oral, resp. rate 16, height 5\' 5"  (1.651 m), weight 103.9 kg, SpO2 93 %.  Physical Exam: Patient is awake, A/O X 4, conversant, and in good spirits. Speech is fluent and appropriate. PERRLA, EOMI. CNs grossly intact. Doing well. MAEW with good strength that is symmetric bilaterally. 5/5 BUE/BLE.   Incision is well approximated with no drainage, erythema, or edema. Dressing is intact with old drainage on the distal  portion.  Disposition: Discharge disposition: 01-Home or Self Care        Allergies as of 11/03/2020   No Known Allergies     Medication List    TAKE these medications   aspirin 81 MG EC tablet Take 81 mg by mouth daily.   atorvastatin 40 MG tablet Commonly known as: LIPITOR Take 40 mg by mouth daily.   cholecalciferol 25 MCG (1000 UNIT) tablet Commonly known as: VITAMIN D3 Take 1,000 Units by mouth every Monday, Wednesday, and Friday.   cyanocobalamin 1000 MCG tablet Take 1,000 mcg by mouth daily.   furosemide 40 MG tablet Commonly known as: LASIX Take 40 mg by mouth daily.   hydrochlorothiazide 25 MG tablet Commonly known as: HYDRODIURIL Take 25 mg by mouth daily.   HYDROcodone-acetaminophen 5-325 MG tablet Commonly known as: NORCO/VICODIN Take 1-2 tablets by mouth every 4 (four) hours as needed for severe pain or moderate pain.   levothyroxine 100 MCG tablet Commonly known as: SYNTHROID Take 100 mcg by mouth daily before breakfast.   losartan 100 MG tablet Commonly known as: COZAAR Take 100 mg by mouth daily.   omeprazole 40 MG capsule Commonly known as: PRILOSEC Take 40 mg by mouth daily.   spironolactone 25 MG tablet Commonly known as: ALDACTONE Take 25 mg by mouth daily.   tiZANidine 2 MG tablet Commonly known as: ZANAFLEX Take 2 mg by mouth 3 (three) times daily. What changed: Another medication with the same name was added. Make sure you understand how and when to take each.   tiZANidine 2 MG tablet Commonly known as: ZANAFLEX Take 1 tablet (2 mg total) by mouth 3 (three) times daily. What changed: You were already taking a medication with the same name, and this prescription was added. Make sure you understand how and when  to take each.        Signed: Marvis Moeller, DNP, NP-C 11/03/2020, 8:34 AM

## 2020-11-03 NOTE — Progress Notes (Signed)
CSW informed that pt and family requesting [place] aide.  Initial PT recommendation was for [facility], pt was seen again this morning and recommendation updated to no PT follow up.  Pt in room with daughter and granddaughter, family is helping but they would like a [place] aide for days when granddaughter is at work.  Discussed that aide could be part of [facility] referral with PT but that [facility] PT is no longer recommended and aide cannot be a stand alone [place] service.  CSW provided contact info for [email or web address], discussed hiring aide, family reports they will probably just continue to make it work with family help.  RN informed.   Lurline Idol, MSW, LCSW 6/8/20229:23 AM

## 2020-11-04 DIAGNOSIS — R338 Other retention of urine: Secondary | ICD-10-CM | POA: Diagnosis not present

## 2020-11-04 DIAGNOSIS — N2889 Other specified disorders of kidney and ureter: Secondary | ICD-10-CM | POA: Diagnosis not present

## 2020-11-04 DIAGNOSIS — K449 Diaphragmatic hernia without obstruction or gangrene: Secondary | ICD-10-CM | POA: Diagnosis not present

## 2020-11-04 DIAGNOSIS — R319 Hematuria, unspecified: Secondary | ICD-10-CM | POA: Diagnosis not present

## 2020-11-04 DIAGNOSIS — M4326 Fusion of spine, lumbar region: Secondary | ICD-10-CM | POA: Diagnosis not present

## 2020-11-04 DIAGNOSIS — M1990 Unspecified osteoarthritis, unspecified site: Secondary | ICD-10-CM | POA: Diagnosis not present

## 2020-11-04 DIAGNOSIS — I10 Essential (primary) hypertension: Secondary | ICD-10-CM | POA: Diagnosis not present

## 2020-11-04 DIAGNOSIS — R3915 Urgency of urination: Secondary | ICD-10-CM | POA: Diagnosis not present

## 2020-11-11 DIAGNOSIS — R339 Retention of urine, unspecified: Secondary | ICD-10-CM | POA: Diagnosis not present

## 2020-11-11 DIAGNOSIS — N2889 Other specified disorders of kidney and ureter: Secondary | ICD-10-CM | POA: Diagnosis not present

## 2020-11-17 DIAGNOSIS — R300 Dysuria: Secondary | ICD-10-CM | POA: Diagnosis not present

## 2020-11-22 DIAGNOSIS — M5416 Radiculopathy, lumbar region: Secondary | ICD-10-CM | POA: Diagnosis not present

## 2020-11-25 ENCOUNTER — Other Ambulatory Visit: Payer: Self-pay | Admitting: Urology

## 2020-11-25 DIAGNOSIS — N2889 Other specified disorders of kidney and ureter: Secondary | ICD-10-CM

## 2020-11-26 DIAGNOSIS — N2889 Other specified disorders of kidney and ureter: Secondary | ICD-10-CM | POA: Diagnosis not present

## 2020-12-13 ENCOUNTER — Other Ambulatory Visit: Payer: Self-pay

## 2020-12-13 ENCOUNTER — Ambulatory Visit
Admission: RE | Admit: 2020-12-13 | Discharge: 2020-12-13 | Disposition: A | Discharge status: Home / Self Care | Payer: Medicare Other | Source: Ambulatory Visit | Attending: Urology | Admitting: Urology

## 2020-12-13 DIAGNOSIS — N2889 Other specified disorders of kidney and ureter: Secondary | ICD-10-CM

## 2020-12-13 DIAGNOSIS — N281 Cyst of kidney, acquired: Secondary | ICD-10-CM | POA: Diagnosis not present

## 2020-12-13 MED ORDER — GADOBENATE DIMEGLUMINE 529 MG/ML IV SOLN
20.0000 mL | Freq: Once | INTRAVENOUS | Status: AC | PRN
  Administered 2020-12-13: 20 mL via INTRAVENOUS

## 2020-12-29 DIAGNOSIS — N2889 Other specified disorders of kidney and ureter: Secondary | ICD-10-CM | POA: Diagnosis not present

## 2021-01-12 DIAGNOSIS — M5416 Radiculopathy, lumbar region: Secondary | ICD-10-CM | POA: Diagnosis not present

## 2021-01-14 ENCOUNTER — Encounter (HOSPITAL_COMMUNITY): Payer: Self-pay | Admitting: Neurosurgery

## 2021-01-26 DIAGNOSIS — E785 Hyperlipidemia, unspecified: Secondary | ICD-10-CM | POA: Diagnosis not present

## 2021-01-26 DIAGNOSIS — E039 Hypothyroidism, unspecified: Secondary | ICD-10-CM | POA: Diagnosis not present

## 2021-01-26 DIAGNOSIS — I10 Essential (primary) hypertension: Secondary | ICD-10-CM | POA: Diagnosis not present

## 2021-02-01 DIAGNOSIS — H40013 Open angle with borderline findings, low risk, bilateral: Secondary | ICD-10-CM | POA: Diagnosis not present

## 2021-02-01 DIAGNOSIS — H2513 Age-related nuclear cataract, bilateral: Secondary | ICD-10-CM | POA: Diagnosis not present

## 2021-02-03 DIAGNOSIS — H6982 Other specified disorders of Eustachian tube, left ear: Secondary | ICD-10-CM | POA: Diagnosis not present

## 2021-02-23 DIAGNOSIS — M48062 Spinal stenosis, lumbar region with neurogenic claudication: Secondary | ICD-10-CM | POA: Diagnosis not present

## 2021-02-23 DIAGNOSIS — M5126 Other intervertebral disc displacement, lumbar region: Secondary | ICD-10-CM | POA: Diagnosis not present

## 2021-02-23 DIAGNOSIS — M545 Low back pain, unspecified: Secondary | ICD-10-CM | POA: Diagnosis not present

## 2021-02-23 DIAGNOSIS — M5416 Radiculopathy, lumbar region: Secondary | ICD-10-CM | POA: Diagnosis not present

## 2021-02-23 DIAGNOSIS — M4316 Spondylolisthesis, lumbar region: Secondary | ICD-10-CM | POA: Diagnosis not present

## 2021-03-03 DIAGNOSIS — N2889 Other specified disorders of kidney and ureter: Secondary | ICD-10-CM | POA: Diagnosis not present

## 2021-03-03 DIAGNOSIS — N281 Cyst of kidney, acquired: Secondary | ICD-10-CM | POA: Insufficient documentation

## 2021-03-03 DIAGNOSIS — Z87898 Personal history of other specified conditions: Secondary | ICD-10-CM | POA: Insufficient documentation

## 2021-03-03 DIAGNOSIS — N3582 Other urethral stricture, female: Secondary | ICD-10-CM | POA: Diagnosis not present

## 2021-03-03 DIAGNOSIS — R82998 Other abnormal findings in urine: Secondary | ICD-10-CM | POA: Diagnosis not present

## 2021-03-03 DIAGNOSIS — N3592 Unspecified urethral stricture, female: Secondary | ICD-10-CM | POA: Diagnosis not present

## 2021-03-22 DIAGNOSIS — R2689 Other abnormalities of gait and mobility: Secondary | ICD-10-CM | POA: Diagnosis not present

## 2021-03-22 DIAGNOSIS — R293 Abnormal posture: Secondary | ICD-10-CM | POA: Diagnosis not present

## 2021-03-22 DIAGNOSIS — M6281 Muscle weakness (generalized): Secondary | ICD-10-CM | POA: Diagnosis not present

## 2021-03-22 DIAGNOSIS — M546 Pain in thoracic spine: Secondary | ICD-10-CM | POA: Diagnosis not present

## 2021-03-24 DIAGNOSIS — M6281 Muscle weakness (generalized): Secondary | ICD-10-CM | POA: Diagnosis not present

## 2021-03-24 DIAGNOSIS — R293 Abnormal posture: Secondary | ICD-10-CM | POA: Diagnosis not present

## 2021-03-24 DIAGNOSIS — R2689 Other abnormalities of gait and mobility: Secondary | ICD-10-CM | POA: Diagnosis not present

## 2021-03-24 DIAGNOSIS — M546 Pain in thoracic spine: Secondary | ICD-10-CM | POA: Diagnosis not present

## 2021-03-28 DIAGNOSIS — E039 Hypothyroidism, unspecified: Secondary | ICD-10-CM | POA: Diagnosis not present

## 2021-03-28 DIAGNOSIS — I10 Essential (primary) hypertension: Secondary | ICD-10-CM | POA: Diagnosis not present

## 2021-03-28 DIAGNOSIS — E785 Hyperlipidemia, unspecified: Secondary | ICD-10-CM | POA: Diagnosis not present

## 2021-03-29 DIAGNOSIS — M546 Pain in thoracic spine: Secondary | ICD-10-CM | POA: Diagnosis not present

## 2021-03-29 DIAGNOSIS — M6281 Muscle weakness (generalized): Secondary | ICD-10-CM | POA: Diagnosis not present

## 2021-03-29 DIAGNOSIS — R2689 Other abnormalities of gait and mobility: Secondary | ICD-10-CM | POA: Diagnosis not present

## 2021-03-29 DIAGNOSIS — R293 Abnormal posture: Secondary | ICD-10-CM | POA: Diagnosis not present

## 2021-03-31 DIAGNOSIS — M546 Pain in thoracic spine: Secondary | ICD-10-CM | POA: Diagnosis not present

## 2021-03-31 DIAGNOSIS — R2689 Other abnormalities of gait and mobility: Secondary | ICD-10-CM | POA: Diagnosis not present

## 2021-03-31 DIAGNOSIS — M6281 Muscle weakness (generalized): Secondary | ICD-10-CM | POA: Diagnosis not present

## 2021-03-31 DIAGNOSIS — R293 Abnormal posture: Secondary | ICD-10-CM | POA: Diagnosis not present

## 2021-04-05 DIAGNOSIS — M6281 Muscle weakness (generalized): Secondary | ICD-10-CM | POA: Diagnosis not present

## 2021-04-05 DIAGNOSIS — M546 Pain in thoracic spine: Secondary | ICD-10-CM | POA: Diagnosis not present

## 2021-04-05 DIAGNOSIS — R293 Abnormal posture: Secondary | ICD-10-CM | POA: Diagnosis not present

## 2021-04-05 DIAGNOSIS — R2689 Other abnormalities of gait and mobility: Secondary | ICD-10-CM | POA: Diagnosis not present

## 2021-04-07 DIAGNOSIS — M6281 Muscle weakness (generalized): Secondary | ICD-10-CM | POA: Diagnosis not present

## 2021-04-07 DIAGNOSIS — M546 Pain in thoracic spine: Secondary | ICD-10-CM | POA: Diagnosis not present

## 2021-04-07 DIAGNOSIS — R293 Abnormal posture: Secondary | ICD-10-CM | POA: Diagnosis not present

## 2021-04-07 DIAGNOSIS — R2689 Other abnormalities of gait and mobility: Secondary | ICD-10-CM | POA: Diagnosis not present

## 2021-04-14 DIAGNOSIS — R2689 Other abnormalities of gait and mobility: Secondary | ICD-10-CM | POA: Diagnosis not present

## 2021-04-14 DIAGNOSIS — M546 Pain in thoracic spine: Secondary | ICD-10-CM | POA: Diagnosis not present

## 2021-04-14 DIAGNOSIS — R293 Abnormal posture: Secondary | ICD-10-CM | POA: Diagnosis not present

## 2021-04-14 DIAGNOSIS — M6281 Muscle weakness (generalized): Secondary | ICD-10-CM | POA: Diagnosis not present

## 2021-04-20 DIAGNOSIS — E039 Hypothyroidism, unspecified: Secondary | ICD-10-CM | POA: Diagnosis not present

## 2021-04-20 DIAGNOSIS — I10 Essential (primary) hypertension: Secondary | ICD-10-CM | POA: Diagnosis not present

## 2021-04-20 DIAGNOSIS — E785 Hyperlipidemia, unspecified: Secondary | ICD-10-CM | POA: Diagnosis not present

## 2021-04-20 DIAGNOSIS — Z79899 Other long term (current) drug therapy: Secondary | ICD-10-CM | POA: Diagnosis not present

## 2021-04-25 DIAGNOSIS — Z981 Arthrodesis status: Secondary | ICD-10-CM | POA: Diagnosis not present

## 2021-04-25 DIAGNOSIS — I10 Essential (primary) hypertension: Secondary | ICD-10-CM | POA: Insufficient documentation

## 2021-04-25 DIAGNOSIS — Z7982 Long term (current) use of aspirin: Secondary | ICD-10-CM | POA: Diagnosis not present

## 2021-04-25 DIAGNOSIS — E039 Hypothyroidism, unspecified: Secondary | ICD-10-CM | POA: Diagnosis not present

## 2021-04-25 DIAGNOSIS — Z79899 Other long term (current) drug therapy: Secondary | ICD-10-CM | POA: Diagnosis not present

## 2021-04-25 DIAGNOSIS — N281 Cyst of kidney, acquired: Secondary | ICD-10-CM | POA: Diagnosis not present

## 2021-04-25 DIAGNOSIS — D49512 Neoplasm of unspecified behavior of left kidney: Secondary | ICD-10-CM | POA: Diagnosis not present

## 2021-04-25 DIAGNOSIS — K219 Gastro-esophageal reflux disease without esophagitis: Secondary | ICD-10-CM | POA: Diagnosis not present

## 2021-04-25 DIAGNOSIS — R600 Localized edema: Secondary | ICD-10-CM | POA: Diagnosis not present

## 2021-04-25 DIAGNOSIS — E7800 Pure hypercholesterolemia, unspecified: Secondary | ICD-10-CM | POA: Diagnosis not present

## 2021-04-25 DIAGNOSIS — Z8249 Family history of ischemic heart disease and other diseases of the circulatory system: Secondary | ICD-10-CM | POA: Diagnosis not present

## 2021-04-25 DIAGNOSIS — Z833 Family history of diabetes mellitus: Secondary | ICD-10-CM | POA: Diagnosis not present

## 2021-04-25 DIAGNOSIS — C642 Malignant neoplasm of left kidney, except renal pelvis: Secondary | ICD-10-CM | POA: Diagnosis not present

## 2021-04-28 DIAGNOSIS — E876 Hypokalemia: Secondary | ICD-10-CM | POA: Diagnosis not present

## 2021-05-02 DIAGNOSIS — Z79899 Other long term (current) drug therapy: Secondary | ICD-10-CM | POA: Diagnosis not present

## 2021-05-02 DIAGNOSIS — Z981 Arthrodesis status: Secondary | ICD-10-CM | POA: Diagnosis not present

## 2021-05-02 DIAGNOSIS — K219 Gastro-esophageal reflux disease without esophagitis: Secondary | ICD-10-CM | POA: Diagnosis not present

## 2021-05-02 DIAGNOSIS — Z8249 Family history of ischemic heart disease and other diseases of the circulatory system: Secondary | ICD-10-CM | POA: Diagnosis not present

## 2021-05-02 DIAGNOSIS — E039 Hypothyroidism, unspecified: Secondary | ICD-10-CM | POA: Diagnosis not present

## 2021-05-02 DIAGNOSIS — Z7982 Long term (current) use of aspirin: Secondary | ICD-10-CM | POA: Diagnosis not present

## 2021-05-02 DIAGNOSIS — Z01810 Encounter for preprocedural cardiovascular examination: Secondary | ICD-10-CM | POA: Diagnosis not present

## 2021-05-02 DIAGNOSIS — E7800 Pure hypercholesterolemia, unspecified: Secondary | ICD-10-CM | POA: Diagnosis not present

## 2021-05-02 DIAGNOSIS — N281 Cyst of kidney, acquired: Secondary | ICD-10-CM | POA: Diagnosis not present

## 2021-05-02 DIAGNOSIS — Z833 Family history of diabetes mellitus: Secondary | ICD-10-CM | POA: Diagnosis not present

## 2021-05-02 DIAGNOSIS — N2889 Other specified disorders of kidney and ureter: Secondary | ICD-10-CM | POA: Diagnosis not present

## 2021-05-02 DIAGNOSIS — I10 Essential (primary) hypertension: Secondary | ICD-10-CM | POA: Diagnosis not present

## 2021-05-02 DIAGNOSIS — C642 Malignant neoplasm of left kidney, except renal pelvis: Secondary | ICD-10-CM | POA: Diagnosis not present

## 2021-05-24 DIAGNOSIS — Z905 Acquired absence of kidney: Secondary | ICD-10-CM | POA: Diagnosis not present

## 2021-05-24 DIAGNOSIS — C642 Malignant neoplasm of left kidney, except renal pelvis: Secondary | ICD-10-CM | POA: Diagnosis not present

## 2021-05-24 DIAGNOSIS — Z87898 Personal history of other specified conditions: Secondary | ICD-10-CM | POA: Diagnosis not present

## 2021-05-24 DIAGNOSIS — Q6432 Congenital stricture of urethra: Secondary | ICD-10-CM | POA: Diagnosis not present

## 2021-05-27 DIAGNOSIS — I10 Essential (primary) hypertension: Secondary | ICD-10-CM | POA: Diagnosis not present

## 2021-05-27 DIAGNOSIS — E785 Hyperlipidemia, unspecified: Secondary | ICD-10-CM | POA: Diagnosis not present

## 2021-05-27 DIAGNOSIS — E039 Hypothyroidism, unspecified: Secondary | ICD-10-CM | POA: Diagnosis not present

## 2021-07-04 DIAGNOSIS — J310 Chronic rhinitis: Secondary | ICD-10-CM | POA: Diagnosis not present

## 2021-07-04 DIAGNOSIS — J029 Acute pharyngitis, unspecified: Secondary | ICD-10-CM | POA: Diagnosis not present

## 2021-07-14 DIAGNOSIS — J0100 Acute maxillary sinusitis, unspecified: Secondary | ICD-10-CM | POA: Diagnosis not present

## 2021-09-20 ENCOUNTER — Ambulatory Visit (INDEPENDENT_AMBULATORY_CARE_PROVIDER_SITE_OTHER): Payer: Medicare Other

## 2021-09-20 ENCOUNTER — Other Ambulatory Visit: Payer: Self-pay | Admitting: *Deleted

## 2021-09-20 ENCOUNTER — Encounter: Payer: Self-pay | Admitting: Sports Medicine

## 2021-09-20 ENCOUNTER — Ambulatory Visit: Payer: Medicare Other | Admitting: Sports Medicine

## 2021-09-20 DIAGNOSIS — I739 Peripheral vascular disease, unspecified: Secondary | ICD-10-CM

## 2021-09-20 DIAGNOSIS — M79675 Pain in left toe(s): Secondary | ICD-10-CM

## 2021-09-20 DIAGNOSIS — L603 Nail dystrophy: Secondary | ICD-10-CM

## 2021-09-20 DIAGNOSIS — M48061 Spinal stenosis, lumbar region without neurogenic claudication: Secondary | ICD-10-CM | POA: Insufficient documentation

## 2021-09-20 DIAGNOSIS — M79674 Pain in right toe(s): Secondary | ICD-10-CM

## 2021-09-20 DIAGNOSIS — S99921A Unspecified injury of right foot, initial encounter: Secondary | ICD-10-CM

## 2021-09-20 NOTE — Progress Notes (Signed)
Subjective: ?Tiffany Sparks is a 81 y.o. female patient seen today in office with complaint of mildly painful thickened and elongated toenails; unable to trim. Patient admits to a conditioner bottle to the top of the foot this morning. Patient has no other pedal complaints at this time.  ? ?Patient Active Problem List  ? Diagnosis Date Noted  ? Degenerative lumbar spinal stenosis 09/20/2021  ? Lower extremity edema 04/25/2021  ? Primary hypertension 04/25/2021  ? 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 History of urinary retention 03/03/2021  ? Renal mass 03/03/2021  ? Spondylolisthesis of lumbar region 11/02/2020  ? 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 ? ?Current Outpatient Medications on File Prior to Visit  ?Medication Sig Dispense Refill  ? methocarbamol (ROBAXIN) 500 MG tablet take 0.5 - 1 tablet by oral route 3 times every day    ? aspirin 81 MG EC tablet Take 81 mg by mouth daily.    ? atorvastatin (LIPITOR) 40 MG tablet Take 40 mg by mouth daily.    ? furosemide (LASIX) 40 MG tablet Take 40 mg by mouth daily.    ? hydrochlorothiazide (HYDRODIURIL) 25 MG tablet Take 25 mg by mouth daily.    ? HYDROcodone-acetaminophen (NORCO/VICODIN) 5-325 MG tablet Take 1-2 tablets by mouth every 4 (four) hours as needed for severe pain or moderate pain. 30 tablet 0  ? levothyroxine (SYNTHROID) 100 MCG tablet Take 100 mcg by mouth daily before breakfast.    ? losartan (COZAAR) 100 MG tablet Take 100 mg by mouth daily.    ? omeprazole (PRILOSEC) 40 MG capsule Take 40 mg by mouth daily.    ? spironolactone (ALDACTONE) 25 MG tablet Take 25 mg by mouth daily.    ? tiZANidine (ZANAFLEX) 2 MG tablet Take 1 tablet (2 mg total) by mouth 3 (three) times daily. 30 tablet 0  ? ?No current facility-administered medications on file prior to visit.  ? ? ?No Known Allergies ? ?Objective: ?Physical Exam ? ?General: Well developed, nourished, no acute distress, awake, alert and oriented x 3 ? ?Vascular: Dorsalis pedis artery 2/4 bilateral,  Posterior tibial artery 1/4 bilateral, skin temperature warm to warm proximal to distal bilateral lower extremities, moderate varicosities, decreased pedal hair present bilateral. ? ?Neurological: Gross sensation present via light touch bilateral.  ? ?Dermatological: Skin is warm, dry, and supple bilateral, Nails 1-10 are tender, long, thick, and discolored with mild subungal debris, no webspace macerations present bilateral, bruise noted to the dorsum of the right foot, no signs of infection bilateral. ? ?Musculoskeletal:+ pes planus foot type. No pain to right foot at area of bruise. ? ?X-ray right foot negative for any acute fracture and there is significant midfoot arthritis and breach suggestive of pes planus ? ?Assessment and Plan:  ?Problem List Items Addressed This Visit   ?None ?Visit Diagnoses   ? ? Foot injury, right, initial encounter    -  Primary  ? right midfoot bruise, dropped bottle of conditioner on foot 09-20-21  ? Relevant Orders  ? DG Foot Complete Right  ? Nail dystrophy      ? Toe pain, bilateral      ? PVD (peripheral vascular disease) ([place])      ? ?  ? ? ?-Examined patient.  ?-X-rays reviewed ?-Discussed treatment options for right midfoot bruise ?-Advised rest ice elevation toe bruise resolves ?-Discussed treatment options for painful dystrophic nails. ?-Mechanically debrided and reduced all painful dystrophic nails with sterile nail nipper and dremel nail file without  incident. ?-Patient to return in 3 months for follow up evaluation or sooner if symptoms worsen. ? ?Landis Martins, DPM ? ?

## 2021-10-31 DIAGNOSIS — Z0000 Encounter for general adult medical examination without abnormal findings: Secondary | ICD-10-CM | POA: Diagnosis not present

## 2021-10-31 DIAGNOSIS — Z1231 Encounter for screening mammogram for malignant neoplasm of breast: Secondary | ICD-10-CM | POA: Diagnosis not present

## 2021-10-31 DIAGNOSIS — E039 Hypothyroidism, unspecified: Secondary | ICD-10-CM | POA: Diagnosis not present

## 2021-10-31 DIAGNOSIS — E785 Hyperlipidemia, unspecified: Secondary | ICD-10-CM | POA: Diagnosis not present

## 2021-10-31 DIAGNOSIS — Z79899 Other long term (current) drug therapy: Secondary | ICD-10-CM | POA: Diagnosis not present

## 2021-10-31 DIAGNOSIS — I10 Essential (primary) hypertension: Secondary | ICD-10-CM | POA: Diagnosis not present

## 2021-11-16 ENCOUNTER — Other Ambulatory Visit: Payer: Self-pay | Admitting: Urology

## 2021-11-16 DIAGNOSIS — C642 Malignant neoplasm of left kidney, except renal pelvis: Secondary | ICD-10-CM

## 2021-12-05 DIAGNOSIS — Z1231 Encounter for screening mammogram for malignant neoplasm of breast: Secondary | ICD-10-CM | POA: Diagnosis not present

## 2021-12-14 ENCOUNTER — Ambulatory Visit
Admission: RE | Admit: 2021-12-14 | Discharge: 2021-12-14 | Disposition: A | Discharge status: Home / Self Care | Payer: Medicare Other | Source: Ambulatory Visit | Attending: Urology | Admitting: Urology

## 2021-12-14 DIAGNOSIS — Z981 Arthrodesis status: Secondary | ICD-10-CM | POA: Diagnosis not present

## 2021-12-14 DIAGNOSIS — I700 Atherosclerosis of aorta: Secondary | ICD-10-CM | POA: Diagnosis not present

## 2021-12-14 DIAGNOSIS — Z9889 Other specified postprocedural states: Secondary | ICD-10-CM | POA: Diagnosis not present

## 2021-12-14 DIAGNOSIS — C642 Malignant neoplasm of left kidney, except renal pelvis: Secondary | ICD-10-CM

## 2021-12-14 MED ORDER — IOPAMIDOL (ISOVUE-300) INJECTION 61%
100.0000 mL | Freq: Once | INTRAVENOUS | Status: AC | PRN
  Administered 2021-12-14: 100 mL via INTRAVENOUS

## 2021-12-19 ENCOUNTER — Ambulatory Visit (INDEPENDENT_AMBULATORY_CARE_PROVIDER_SITE_OTHER): Payer: Self-pay | Admitting: Podiatry

## 2021-12-19 DIAGNOSIS — Z91199 Patient's noncompliance with other medical treatment and regimen due to unspecified reason: Secondary | ICD-10-CM

## 2021-12-19 NOTE — Progress Notes (Signed)
No show

## 2022-02-09 DIAGNOSIS — H2513 Age-related nuclear cataract, bilateral: Secondary | ICD-10-CM | POA: Diagnosis not present

## 2022-02-09 DIAGNOSIS — H43393 Other vitreous opacities, bilateral: Secondary | ICD-10-CM | POA: Diagnosis not present

## 2022-03-07 DIAGNOSIS — Z905 Acquired absence of kidney: Secondary | ICD-10-CM | POA: Diagnosis not present

## 2022-03-16 DIAGNOSIS — R051 Acute cough: Secondary | ICD-10-CM | POA: Diagnosis not present

## 2022-03-16 DIAGNOSIS — R0981 Nasal congestion: Secondary | ICD-10-CM | POA: Diagnosis not present

## 2022-03-16 DIAGNOSIS — J3489 Other specified disorders of nose and nasal sinuses: Secondary | ICD-10-CM | POA: Diagnosis not present

## 2022-03-16 DIAGNOSIS — R6883 Chills (without fever): Secondary | ICD-10-CM | POA: Diagnosis not present

## 2022-05-10 DIAGNOSIS — I700 Atherosclerosis of aorta: Secondary | ICD-10-CM | POA: Diagnosis not present

## 2022-05-10 DIAGNOSIS — E785 Hyperlipidemia, unspecified: Secondary | ICD-10-CM | POA: Diagnosis not present

## 2022-05-10 DIAGNOSIS — Z79899 Other long term (current) drug therapy: Secondary | ICD-10-CM | POA: Diagnosis not present

## 2022-05-10 DIAGNOSIS — I10 Essential (primary) hypertension: Secondary | ICD-10-CM | POA: Diagnosis not present

## 2022-05-10 DIAGNOSIS — M549 Dorsalgia, unspecified: Secondary | ICD-10-CM | POA: Diagnosis not present

## 2022-05-10 DIAGNOSIS — E039 Hypothyroidism, unspecified: Secondary | ICD-10-CM | POA: Diagnosis not present

## 2022-06-22 DIAGNOSIS — Z79899 Other long term (current) drug therapy: Secondary | ICD-10-CM | POA: Diagnosis not present

## 2022-07-10 DIAGNOSIS — M25522 Pain in left elbow: Secondary | ICD-10-CM | POA: Diagnosis not present

## 2022-07-10 DIAGNOSIS — M79601 Pain in right arm: Secondary | ICD-10-CM | POA: Diagnosis not present

## 2022-07-10 DIAGNOSIS — I700 Atherosclerosis of aorta: Secondary | ICD-10-CM | POA: Diagnosis not present

## 2022-07-10 DIAGNOSIS — M549 Dorsalgia, unspecified: Secondary | ICD-10-CM | POA: Diagnosis not present

## 2022-07-29 IMAGING — MR MR LUMBAR SPINE W/O CM
4 of 5 series · 26 of 48 positions shown · non-contrast
Comparison: None.

CLINICAL DATA: Back pain radiating into the legs

EXAM:
MRI LUMBAR SPINE WITHOUT CONTRAST
TECHNIQUE: Multiplanar, multisequence MR imaging of the lumbar spine was
performed. No intravenous contrast was administered.

[Series 2: T2 · sagittal · 4.0mm · 0.57mm/px · 6 of 16 slices shown (1 of 2)]
[im 1/16]
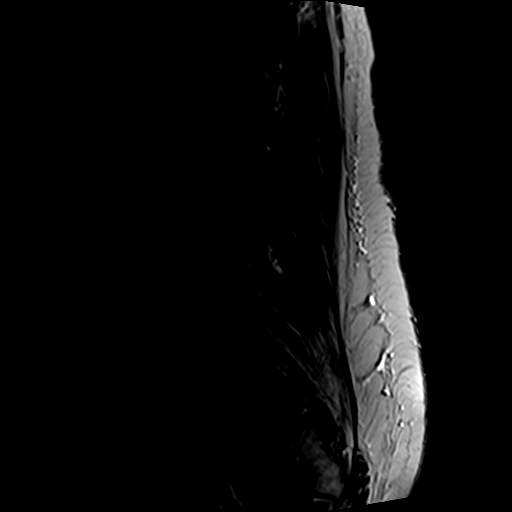
[im 4/16]
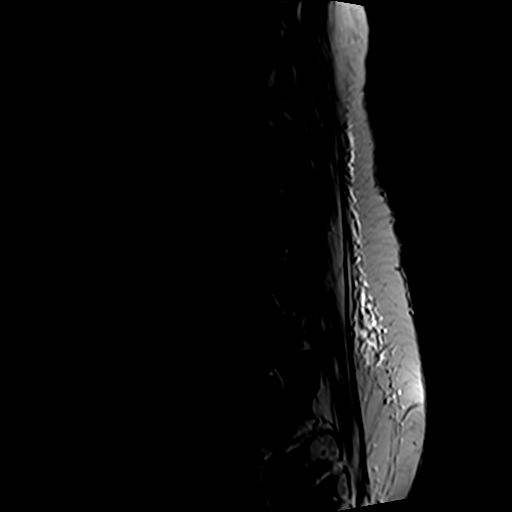
[im 7/16]
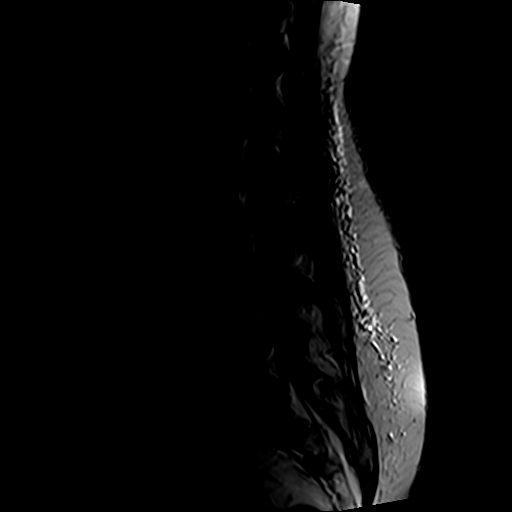
[im 10/16]
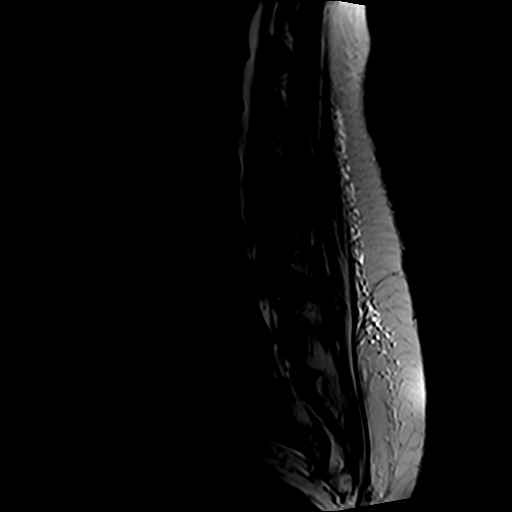
[im 13/16]
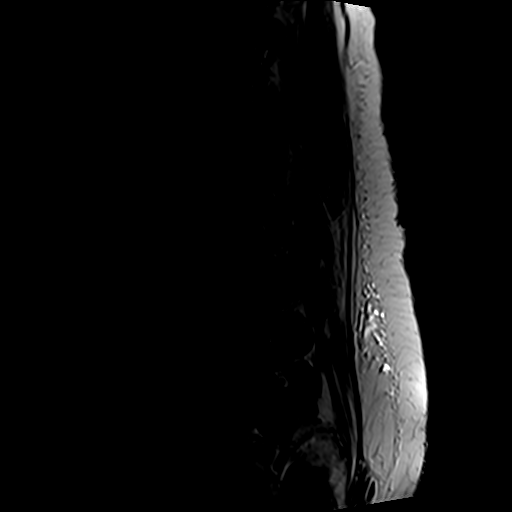
[im 16/16]
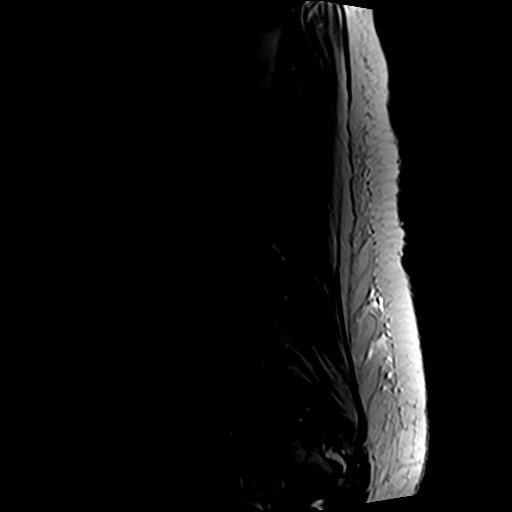

[Series 4: T1 · sagittal · 4.0mm · 0.57mm/px · 6 of 16 slices shown (1 of 2)]
[im 1/16]
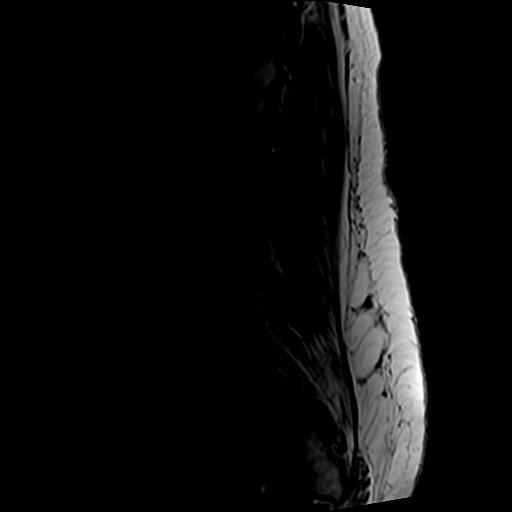
[im 4/16]
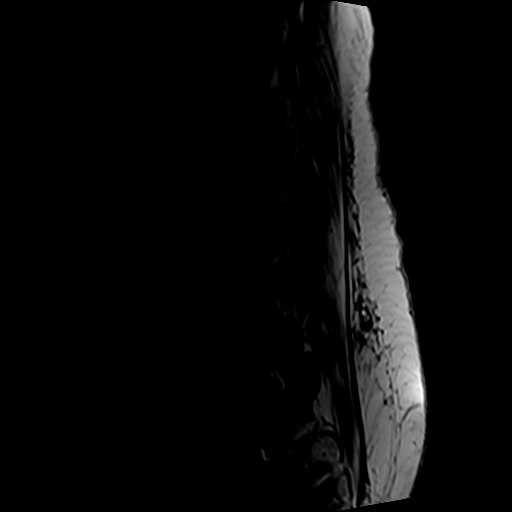
[im 7/16]
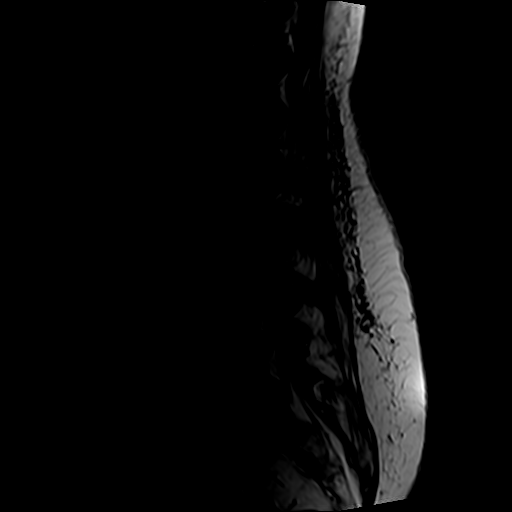
[im 10/16]
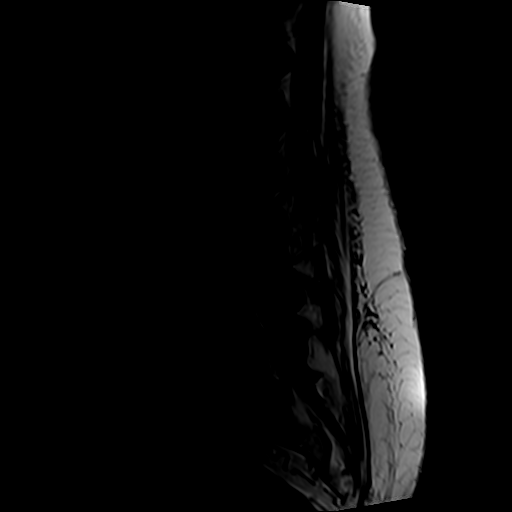
[im 13/16]
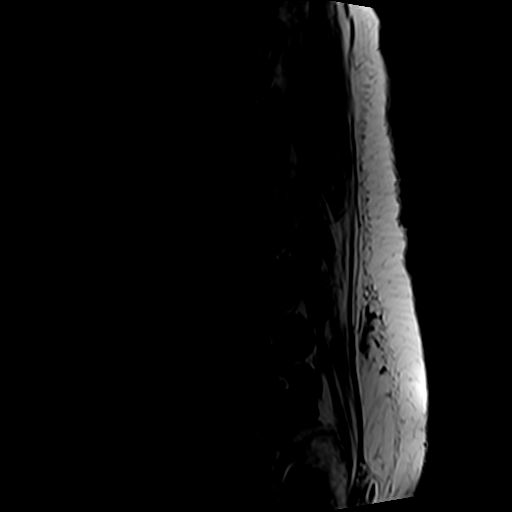
[im 16/16]
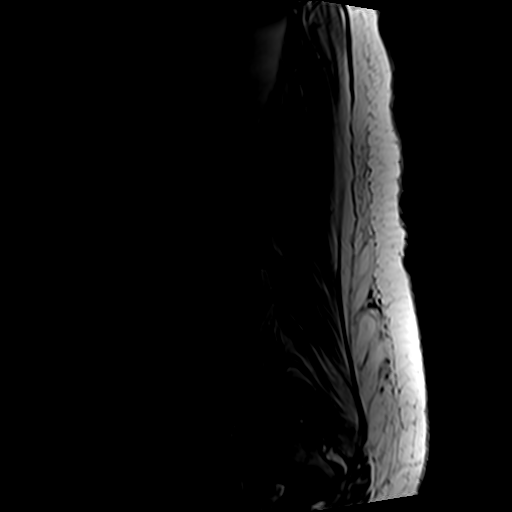

[Series 5: T2 · axial · 4.0mm · 0.70mm/px · z∈[-105,+102]mm · 9 of 40 slices shown (2 of 2)]
[im 1/40]
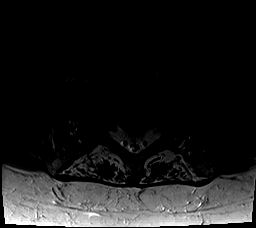
[im 6/40]
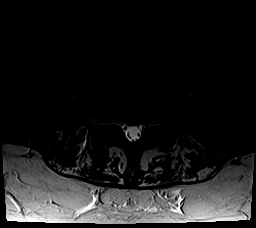
[im 12/40]
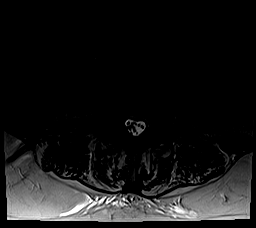
[im 17/40]
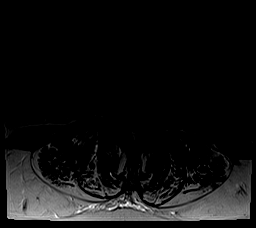
[im 20/40]
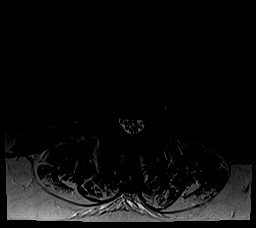
[im 23/40]
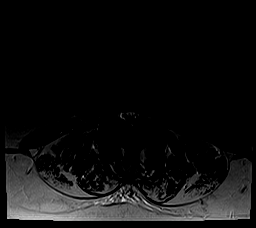
[im 28/40]
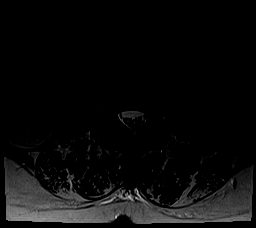
[im 34/40]
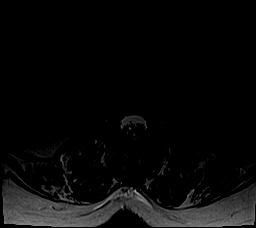
[im 40/40]
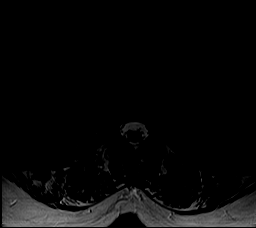

[Series 6: T1 · axial · 4.0mm · 0.35mm/px · z∈[-105,+71]mm · 5 of 40 slices shown (2 of 2)]
[im 1/40]
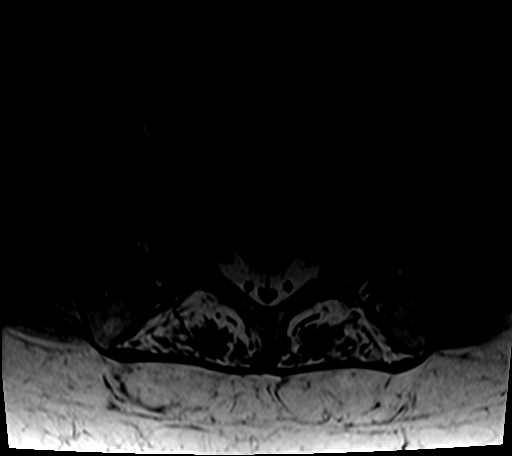
[im 6/40]
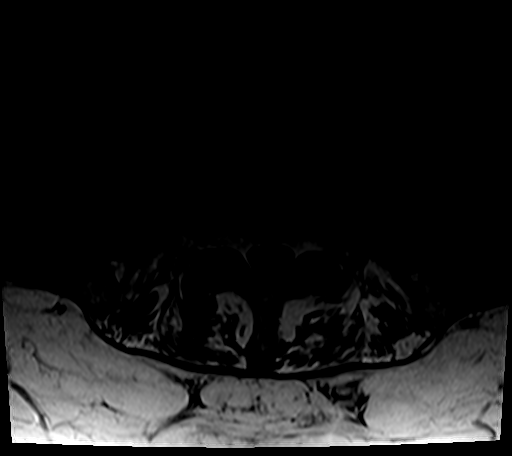
[im 12/40]
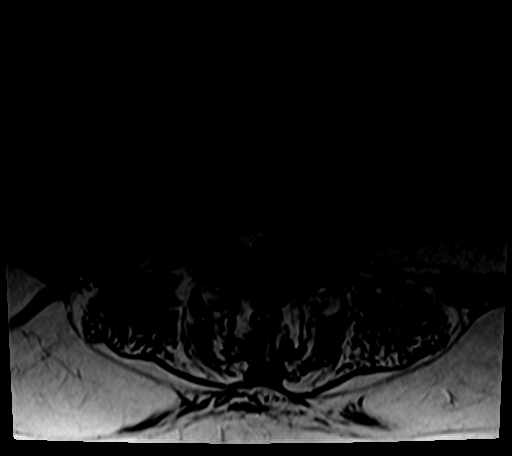
[im 20/40]
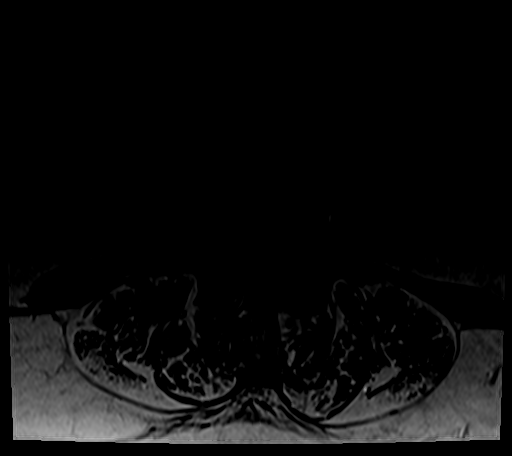
[im 34/40]
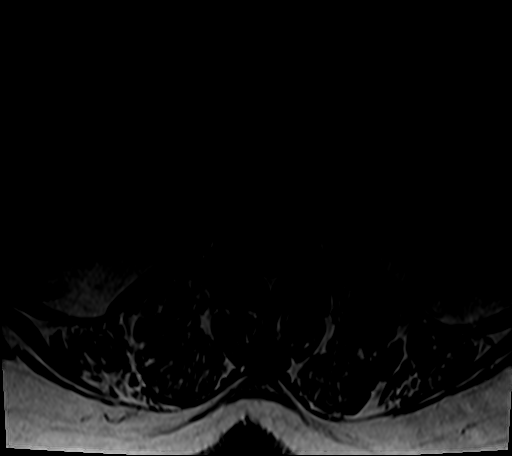

[26 of 48 positions shown; findings below may reference images not displayed]

FINDINGS: Segmentation: 5 lumbar type vertebrae based on the available
coverage

Alignment:  Grade 1 anterolisthesis at L4-5

Vertebrae:  No fracture, evidence of discitis, or bone lesion.

Conus medullaris and cauda equina: Conus extends to the L1 level.
Conus and cauda equina appear normal.

Paraspinal and other soft tissues: Right renal cyst. Atrophy of
intrinsic back muscles

Disc levels:

T12- L1: Mild facet spurring

L1-L2: Degenerative facet spurring

L2-L3: Degenerative facet spurring with ligamentum flavum
thickening. Mild anterolisthesis. Foraminal predominant disc bulging

L3-L4: Severe facet osteoarthritis with spurring and ligamentum
flavum thickening. Anterolisthesis. Severe spinal stenosis with
high-grade neural compression. Moderate bilateral foraminal
narrowing

L4-L5: Disc narrowing and bulging with endplate ridging. Bilateral
degenerative facet spurring. Moderate spinal stenosis. L5
impingement at both subarticular recesses

L5-S1:Disc narrowing and bulging with endplate ridging especially in
the far lateral region where there is contact of the L5 nerve roots.
Patent canal and foramina
IMPRESSION: 1. Multilevel degenerative disease especially affecting facets with
L2-3 and L3-4 anterolisthesis.
2. L3-4 severe spinal stenosis. Moderate biforaminal narrowing at
the same level.
3. L4-5 moderate spinal stenosis with bilateral L5 impingement at
the subarticular recesses.

## 2022-11-02 DIAGNOSIS — E785 Hyperlipidemia, unspecified: Secondary | ICD-10-CM | POA: Diagnosis not present

## 2022-11-02 DIAGNOSIS — E039 Hypothyroidism, unspecified: Secondary | ICD-10-CM | POA: Diagnosis not present

## 2022-11-02 DIAGNOSIS — Z0000 Encounter for general adult medical examination without abnormal findings: Secondary | ICD-10-CM | POA: Diagnosis not present

## 2022-11-02 DIAGNOSIS — Z79899 Other long term (current) drug therapy: Secondary | ICD-10-CM | POA: Diagnosis not present

## 2022-11-02 DIAGNOSIS — Z7189 Other specified counseling: Secondary | ICD-10-CM | POA: Diagnosis not present

## 2022-11-02 DIAGNOSIS — Z1231 Encounter for screening mammogram for malignant neoplasm of breast: Secondary | ICD-10-CM | POA: Diagnosis not present

## 2022-11-02 DIAGNOSIS — M5136 Other intervertebral disc degeneration, lumbar region: Secondary | ICD-10-CM | POA: Diagnosis not present

## 2022-11-02 DIAGNOSIS — I700 Atherosclerosis of aorta: Secondary | ICD-10-CM | POA: Diagnosis not present

## 2022-11-02 DIAGNOSIS — I10 Essential (primary) hypertension: Secondary | ICD-10-CM | POA: Diagnosis not present

## 2022-11-03 ENCOUNTER — Other Ambulatory Visit: Payer: Self-pay | Admitting: Internal Medicine

## 2022-11-03 DIAGNOSIS — M5136 Other intervertebral disc degeneration, lumbar region: Secondary | ICD-10-CM

## 2022-11-25 ENCOUNTER — Ambulatory Visit
Admission: RE | Admit: 2022-11-25 | Discharge: 2022-11-25 | Disposition: A | Discharge status: Home / Self Care | Payer: Medicare Other | Source: Ambulatory Visit | Attending: Internal Medicine | Admitting: Internal Medicine

## 2022-11-25 DIAGNOSIS — M5126 Other intervertebral disc displacement, lumbar region: Secondary | ICD-10-CM | POA: Diagnosis not present

## 2022-11-25 DIAGNOSIS — C649 Malignant neoplasm of unspecified kidney, except renal pelvis: Secondary | ICD-10-CM | POA: Diagnosis not present

## 2022-11-25 DIAGNOSIS — M5127 Other intervertebral disc displacement, lumbosacral region: Secondary | ICD-10-CM | POA: Diagnosis not present

## 2022-11-25 DIAGNOSIS — M5136 Other intervertebral disc degeneration, lumbar region: Secondary | ICD-10-CM

## 2022-11-25 DIAGNOSIS — M47816 Spondylosis without myelopathy or radiculopathy, lumbar region: Secondary | ICD-10-CM | POA: Diagnosis not present

## 2022-11-25 MED ORDER — GADOPICLENOL 0.5 MMOL/ML IV SOLN
10.0000 mL | Freq: Once | INTRAVENOUS | Status: AC | PRN
  Administered 2022-11-25: 10 mL via INTRAVENOUS

## 2022-12-06 DIAGNOSIS — R070 Pain in throat: Secondary | ICD-10-CM | POA: Diagnosis not present

## 2022-12-06 DIAGNOSIS — J069 Acute upper respiratory infection, unspecified: Secondary | ICD-10-CM | POA: Diagnosis not present

## 2022-12-11 DIAGNOSIS — Z1231 Encounter for screening mammogram for malignant neoplasm of breast: Secondary | ICD-10-CM | POA: Diagnosis not present

## 2022-12-27 DIAGNOSIS — M546 Pain in thoracic spine: Secondary | ICD-10-CM | POA: Diagnosis not present

## 2022-12-27 DIAGNOSIS — R079 Chest pain, unspecified: Secondary | ICD-10-CM | POA: Diagnosis not present

## 2022-12-27 DIAGNOSIS — Z79899 Other long term (current) drug therapy: Secondary | ICD-10-CM | POA: Diagnosis not present

## 2022-12-27 DIAGNOSIS — I2510 Atherosclerotic heart disease of native coronary artery without angina pectoris: Secondary | ICD-10-CM | POA: Diagnosis not present

## 2022-12-27 DIAGNOSIS — M549 Dorsalgia, unspecified: Secondary | ICD-10-CM | POA: Diagnosis not present

## 2022-12-27 DIAGNOSIS — Z85528 Personal history of other malignant neoplasm of kidney: Secondary | ICD-10-CM | POA: Diagnosis not present

## 2022-12-27 DIAGNOSIS — K5730 Diverticulosis of large intestine without perforation or abscess without bleeding: Secondary | ICD-10-CM | POA: Diagnosis not present

## 2022-12-27 DIAGNOSIS — I4510 Unspecified right bundle-branch block: Secondary | ICD-10-CM | POA: Diagnosis not present

## 2022-12-27 DIAGNOSIS — R9431 Abnormal electrocardiogram [ECG] [EKG]: Secondary | ICD-10-CM | POA: Diagnosis not present

## 2022-12-27 DIAGNOSIS — B029 Zoster without complications: Secondary | ICD-10-CM | POA: Diagnosis not present

## 2022-12-27 DIAGNOSIS — R110 Nausea: Secondary | ICD-10-CM | POA: Diagnosis not present

## 2022-12-27 DIAGNOSIS — K838 Other specified diseases of biliary tract: Secondary | ICD-10-CM | POA: Diagnosis not present

## 2022-12-27 DIAGNOSIS — I493 Ventricular premature depolarization: Secondary | ICD-10-CM | POA: Diagnosis not present

## 2022-12-27 DIAGNOSIS — Z20822 Contact with and (suspected) exposure to covid-19: Secondary | ICD-10-CM | POA: Diagnosis not present

## 2022-12-27 DIAGNOSIS — I700 Atherosclerosis of aorta: Secondary | ICD-10-CM | POA: Diagnosis not present

## 2022-12-27 DIAGNOSIS — I10 Essential (primary) hypertension: Secondary | ICD-10-CM | POA: Diagnosis not present

## 2023-01-03 DIAGNOSIS — B029 Zoster without complications: Secondary | ICD-10-CM | POA: Diagnosis not present

## 2023-02-12 DIAGNOSIS — H2513 Age-related nuclear cataract, bilateral: Secondary | ICD-10-CM | POA: Diagnosis not present

## 2023-02-12 DIAGNOSIS — H40013 Open angle with borderline findings, low risk, bilateral: Secondary | ICD-10-CM | POA: Diagnosis not present

## 2023-02-14 DIAGNOSIS — E785 Hyperlipidemia, unspecified: Secondary | ICD-10-CM | POA: Diagnosis not present

## 2023-02-14 DIAGNOSIS — Z79899 Other long term (current) drug therapy: Secondary | ICD-10-CM | POA: Diagnosis not present

## 2023-02-14 DIAGNOSIS — K219 Gastro-esophageal reflux disease without esophagitis: Secondary | ICD-10-CM | POA: Diagnosis not present

## 2023-02-14 DIAGNOSIS — E039 Hypothyroidism, unspecified: Secondary | ICD-10-CM | POA: Diagnosis not present

## 2023-02-14 DIAGNOSIS — E559 Vitamin D deficiency, unspecified: Secondary | ICD-10-CM | POA: Diagnosis not present

## 2023-02-14 DIAGNOSIS — I10 Essential (primary) hypertension: Secondary | ICD-10-CM | POA: Diagnosis not present

## 2023-02-14 DIAGNOSIS — B0229 Other postherpetic nervous system involvement: Secondary | ICD-10-CM | POA: Diagnosis not present

## 2023-03-08 DIAGNOSIS — H25811 Combined forms of age-related cataract, right eye: Secondary | ICD-10-CM | POA: Diagnosis not present

## 2023-03-08 DIAGNOSIS — Z01818 Encounter for other preprocedural examination: Secondary | ICD-10-CM | POA: Diagnosis not present

## 2023-03-08 DIAGNOSIS — H25812 Combined forms of age-related cataract, left eye: Secondary | ICD-10-CM | POA: Diagnosis not present

## 2023-03-08 DIAGNOSIS — H2511 Age-related nuclear cataract, right eye: Secondary | ICD-10-CM | POA: Diagnosis not present

## 2023-03-08 DIAGNOSIS — H269 Unspecified cataract: Secondary | ICD-10-CM | POA: Diagnosis not present

## 2023-04-02 DIAGNOSIS — S81811A Laceration without foreign body, right lower leg, initial encounter: Secondary | ICD-10-CM | POA: Diagnosis not present

## 2023-04-02 DIAGNOSIS — W19XXXA Unspecified fall, initial encounter: Secondary | ICD-10-CM | POA: Diagnosis not present

## 2023-04-05 DIAGNOSIS — C642 Malignant neoplasm of left kidney, except renal pelvis: Secondary | ICD-10-CM | POA: Diagnosis not present

## 2023-04-09 DIAGNOSIS — Z79899 Other long term (current) drug therapy: Secondary | ICD-10-CM | POA: Diagnosis not present

## 2023-04-09 DIAGNOSIS — M1990 Unspecified osteoarthritis, unspecified site: Secondary | ICD-10-CM | POA: Diagnosis not present

## 2023-04-09 DIAGNOSIS — Z792 Long term (current) use of antibiotics: Secondary | ICD-10-CM | POA: Diagnosis not present

## 2023-04-09 DIAGNOSIS — L97901 Non-pressure chronic ulcer of unspecified part of unspecified lower leg limited to breakdown of skin: Secondary | ICD-10-CM | POA: Diagnosis not present

## 2023-04-09 DIAGNOSIS — Z7982 Long term (current) use of aspirin: Secondary | ICD-10-CM | POA: Diagnosis not present

## 2023-04-09 DIAGNOSIS — L03115 Cellulitis of right lower limb: Secondary | ICD-10-CM | POA: Diagnosis not present

## 2023-04-10 DIAGNOSIS — L03115 Cellulitis of right lower limb: Secondary | ICD-10-CM | POA: Diagnosis not present

## 2023-04-10 DIAGNOSIS — L97901 Non-pressure chronic ulcer of unspecified part of unspecified lower leg limited to breakdown of skin: Secondary | ICD-10-CM | POA: Diagnosis not present

## 2023-04-11 DIAGNOSIS — L97901 Non-pressure chronic ulcer of unspecified part of unspecified lower leg limited to breakdown of skin: Secondary | ICD-10-CM | POA: Diagnosis not present

## 2023-04-11 DIAGNOSIS — L03115 Cellulitis of right lower limb: Secondary | ICD-10-CM | POA: Diagnosis not present

## 2023-04-12 DIAGNOSIS — L97901 Non-pressure chronic ulcer of unspecified part of unspecified lower leg limited to breakdown of skin: Secondary | ICD-10-CM | POA: Diagnosis not present

## 2023-04-12 DIAGNOSIS — L03115 Cellulitis of right lower limb: Secondary | ICD-10-CM | POA: Diagnosis not present

## 2023-04-18 DIAGNOSIS — L03119 Cellulitis of unspecified part of limb: Secondary | ICD-10-CM | POA: Diagnosis not present

## 2023-04-18 DIAGNOSIS — S80811A Abrasion, right lower leg, initial encounter: Secondary | ICD-10-CM | POA: Diagnosis not present

## 2023-04-24 DIAGNOSIS — L03119 Cellulitis of unspecified part of limb: Secondary | ICD-10-CM | POA: Diagnosis not present

## 2023-04-24 DIAGNOSIS — S80811A Abrasion, right lower leg, initial encounter: Secondary | ICD-10-CM | POA: Diagnosis not present

## 2023-05-01 DIAGNOSIS — L03119 Cellulitis of unspecified part of limb: Secondary | ICD-10-CM | POA: Diagnosis not present

## 2023-05-01 DIAGNOSIS — S80811A Abrasion, right lower leg, initial encounter: Secondary | ICD-10-CM | POA: Diagnosis not present

## 2023-05-01 DIAGNOSIS — L03115 Cellulitis of right lower limb: Secondary | ICD-10-CM | POA: Diagnosis not present

## 2023-05-02 DIAGNOSIS — Z872 Personal history of diseases of the skin and subcutaneous tissue: Secondary | ICD-10-CM | POA: Diagnosis not present

## 2023-05-02 DIAGNOSIS — I10 Essential (primary) hypertension: Secondary | ICD-10-CM | POA: Diagnosis not present

## 2023-05-02 DIAGNOSIS — Z79899 Other long term (current) drug therapy: Secondary | ICD-10-CM | POA: Diagnosis not present

## 2023-05-29 DIAGNOSIS — E871 Hypo-osmolality and hyponatremia: Secondary | ICD-10-CM | POA: Diagnosis not present

## 2023-07-26 DIAGNOSIS — S51012A Laceration without foreign body of left elbow, initial encounter: Secondary | ICD-10-CM | POA: Diagnosis not present

## 2023-07-27 DIAGNOSIS — Z981 Arthrodesis status: Secondary | ICD-10-CM | POA: Diagnosis not present

## 2023-08-07 DIAGNOSIS — M4316 Spondylolisthesis, lumbar region: Secondary | ICD-10-CM | POA: Diagnosis not present

## 2023-08-07 DIAGNOSIS — Z981 Arthrodesis status: Secondary | ICD-10-CM | POA: Diagnosis not present

## 2023-08-08 DIAGNOSIS — K219 Gastro-esophageal reflux disease without esophagitis: Secondary | ICD-10-CM | POA: Diagnosis not present

## 2023-08-08 DIAGNOSIS — E559 Vitamin D deficiency, unspecified: Secondary | ICD-10-CM | POA: Diagnosis not present

## 2023-08-08 DIAGNOSIS — I10 Essential (primary) hypertension: Secondary | ICD-10-CM | POA: Diagnosis not present

## 2023-08-08 DIAGNOSIS — Z79899 Other long term (current) drug therapy: Secondary | ICD-10-CM | POA: Diagnosis not present

## 2023-08-08 DIAGNOSIS — E785 Hyperlipidemia, unspecified: Secondary | ICD-10-CM | POA: Diagnosis not present

## 2023-08-08 DIAGNOSIS — Z6837 Body mass index (BMI) 37.0-37.9, adult: Secondary | ICD-10-CM | POA: Diagnosis not present

## 2023-08-08 DIAGNOSIS — M549 Dorsalgia, unspecified: Secondary | ICD-10-CM | POA: Diagnosis not present

## 2023-08-08 DIAGNOSIS — E039 Hypothyroidism, unspecified: Secondary | ICD-10-CM | POA: Diagnosis not present

## 2023-09-11 DIAGNOSIS — Z6836 Body mass index (BMI) 36.0-36.9, adult: Secondary | ICD-10-CM | POA: Diagnosis not present

## 2023-09-11 DIAGNOSIS — L03116 Cellulitis of left lower limb: Secondary | ICD-10-CM | POA: Diagnosis not present

## 2023-09-11 DIAGNOSIS — L905 Scar conditions and fibrosis of skin: Secondary | ICD-10-CM | POA: Diagnosis not present

## 2023-09-11 DIAGNOSIS — S81812A Laceration without foreign body, left lower leg, initial encounter: Secondary | ICD-10-CM | POA: Diagnosis not present

## 2023-09-29 DIAGNOSIS — L03032 Cellulitis of left toe: Secondary | ICD-10-CM | POA: Diagnosis not present

## 2023-09-29 DIAGNOSIS — M79675 Pain in left toe(s): Secondary | ICD-10-CM | POA: Diagnosis not present

## 2023-09-29 DIAGNOSIS — S90935A Unspecified superficial injury of left lesser toe(s), initial encounter: Secondary | ICD-10-CM | POA: Diagnosis not present

## 2023-11-13 DIAGNOSIS — Z1331 Encounter for screening for depression: Secondary | ICD-10-CM | POA: Diagnosis not present

## 2023-11-13 DIAGNOSIS — E785 Hyperlipidemia, unspecified: Secondary | ICD-10-CM | POA: Diagnosis not present

## 2023-11-13 DIAGNOSIS — E039 Hypothyroidism, unspecified: Secondary | ICD-10-CM | POA: Diagnosis not present

## 2023-11-13 DIAGNOSIS — I10 Essential (primary) hypertension: Secondary | ICD-10-CM | POA: Diagnosis not present

## 2023-11-13 DIAGNOSIS — Z7189 Other specified counseling: Secondary | ICD-10-CM | POA: Diagnosis not present

## 2023-11-13 DIAGNOSIS — K219 Gastro-esophageal reflux disease without esophagitis: Secondary | ICD-10-CM | POA: Diagnosis not present

## 2023-11-13 DIAGNOSIS — Z6834 Body mass index (BMI) 34.0-34.9, adult: Secondary | ICD-10-CM | POA: Diagnosis not present

## 2023-11-13 DIAGNOSIS — Z0000 Encounter for general adult medical examination without abnormal findings: Secondary | ICD-10-CM | POA: Diagnosis not present

## 2024-02-12 DIAGNOSIS — H524 Presbyopia: Secondary | ICD-10-CM | POA: Diagnosis not present

## 2024-03-11 DIAGNOSIS — E785 Hyperlipidemia, unspecified: Secondary | ICD-10-CM | POA: Diagnosis not present

## 2024-03-11 DIAGNOSIS — Z008 Encounter for other general examination: Secondary | ICD-10-CM | POA: Diagnosis not present

## 2024-03-11 DIAGNOSIS — I10 Essential (primary) hypertension: Secondary | ICD-10-CM | POA: Diagnosis not present

## 2024-03-17 DIAGNOSIS — E785 Hyperlipidemia, unspecified: Secondary | ICD-10-CM | POA: Diagnosis not present

## 2024-03-17 DIAGNOSIS — E039 Hypothyroidism, unspecified: Secondary | ICD-10-CM | POA: Diagnosis not present

## 2024-03-17 DIAGNOSIS — Z79899 Other long term (current) drug therapy: Secondary | ICD-10-CM | POA: Diagnosis not present

## 2024-03-17 DIAGNOSIS — I10 Essential (primary) hypertension: Secondary | ICD-10-CM | POA: Diagnosis not present
